# Patient Record
Sex: Male | Born: 1970 | Race: Black or African American | Hispanic: No | Smoking: Current every day smoker
Health system: Southern US, Community
[De-identification: ages and names within clinical notes are randomized; demographics above are authoritative.]

## PROBLEM LIST (undated history)

## (undated) DIAGNOSIS — I609 Nontraumatic subarachnoid hemorrhage, unspecified: Secondary | ICD-10-CM

## (undated) DIAGNOSIS — S065XAA Traumatic subdural hemorrhage with loss of consciousness status unknown, initial encounter: Secondary | ICD-10-CM

## (undated) DIAGNOSIS — I10 Essential (primary) hypertension: Secondary | ICD-10-CM

## (undated) DIAGNOSIS — S065X9A Traumatic subdural hemorrhage with loss of consciousness of unspecified duration, initial encounter: Secondary | ICD-10-CM

## (undated) HISTORY — PX: ANKLE SURGERY: SHX546

## (undated) HISTORY — PX: HERNIA REPAIR: SHX51

---

## 2003-06-23 ENCOUNTER — Emergency Department (HOSPITAL_COMMUNITY): Admission: EM | Admit: 2003-06-23 | Discharge: 2003-06-23 | Payer: Self-pay | Admitting: Emergency Medicine

## 2006-01-23 ENCOUNTER — Emergency Department (HOSPITAL_COMMUNITY): Admission: EM | Admit: 2006-01-23 | Discharge: 2006-01-23 | Payer: Self-pay | Admitting: Emergency Medicine

## 2006-05-04 ENCOUNTER — Emergency Department (HOSPITAL_COMMUNITY): Admission: EM | Admit: 2006-05-04 | Discharge: 2006-05-04 | Payer: Self-pay | Admitting: Emergency Medicine

## 2006-05-07 ENCOUNTER — Emergency Department (HOSPITAL_COMMUNITY): Admission: EM | Admit: 2006-05-07 | Discharge: 2006-05-07 | Payer: Self-pay | Admitting: Emergency Medicine

## 2007-04-28 ENCOUNTER — Emergency Department (HOSPITAL_COMMUNITY): Admission: EM | Admit: 2007-04-28 | Discharge: 2007-04-28 | Payer: Self-pay | Admitting: Emergency Medicine

## 2007-07-13 ENCOUNTER — Emergency Department (HOSPITAL_COMMUNITY): Admission: EM | Admit: 2007-07-13 | Discharge: 2007-07-13 | Payer: Self-pay | Admitting: Emergency Medicine

## 2007-11-15 ENCOUNTER — Emergency Department (HOSPITAL_COMMUNITY): Admission: EM | Admit: 2007-11-15 | Discharge: 2007-11-15 | Payer: Self-pay | Admitting: Family Medicine

## 2007-12-02 ENCOUNTER — Emergency Department (HOSPITAL_COMMUNITY): Admission: EM | Admit: 2007-12-02 | Discharge: 2007-12-03 | Payer: Self-pay | Admitting: Emergency Medicine

## 2007-12-12 ENCOUNTER — Emergency Department (HOSPITAL_COMMUNITY): Admission: EM | Admit: 2007-12-12 | Discharge: 2007-12-12 | Payer: Self-pay | Admitting: Emergency Medicine

## 2008-01-26 ENCOUNTER — Emergency Department (HOSPITAL_COMMUNITY): Admission: EM | Admit: 2008-01-26 | Discharge: 2008-01-26 | Payer: Self-pay | Admitting: Emergency Medicine

## 2009-05-04 ENCOUNTER — Emergency Department (HOSPITAL_COMMUNITY): Admission: EM | Admit: 2009-05-04 | Discharge: 2009-05-04 | Payer: Self-pay | Admitting: Emergency Medicine

## 2009-08-08 ENCOUNTER — Emergency Department (HOSPITAL_COMMUNITY): Admission: EM | Admit: 2009-08-08 | Discharge: 2009-08-08 | Payer: Self-pay | Admitting: Emergency Medicine

## 2009-08-12 ENCOUNTER — Emergency Department (HOSPITAL_COMMUNITY): Admission: EM | Admit: 2009-08-12 | Discharge: 2009-08-12 | Payer: Self-pay | Admitting: Emergency Medicine

## 2009-08-16 ENCOUNTER — Ambulatory Visit (HOSPITAL_COMMUNITY): Admission: RE | Admit: 2009-08-16 | Discharge: 2009-08-16 | Payer: Self-pay | Admitting: Orthopedic Surgery

## 2010-03-06 ENCOUNTER — Emergency Department (HOSPITAL_COMMUNITY): Admission: EM | Admit: 2010-03-06 | Discharge: 2010-03-06 | Payer: Self-pay | Admitting: Emergency Medicine

## 2010-04-15 ENCOUNTER — Emergency Department (HOSPITAL_COMMUNITY): Admission: EM | Admit: 2010-04-15 | Discharge: 2010-04-15 | Payer: Self-pay | Admitting: Family Medicine

## 2010-10-29 LAB — URINALYSIS, ROUTINE W REFLEX MICROSCOPIC
Bilirubin Urine: NEGATIVE
Leukocytes, UA: NEGATIVE
Nitrite: NEGATIVE
Protein, ur: NEGATIVE mg/dL
Urobilinogen, UA: 0.2 mg/dL (ref 0.0–1.0)

## 2010-10-29 LAB — BASIC METABOLIC PANEL
BUN: 9 mg/dL (ref 6–23)
Calcium: 9.1 mg/dL (ref 8.4–10.5)
GFR calc Af Amer: >60 mL/min (ref >60)
Potassium: 3.9 mEq/L (ref 3.5–5.1)
Sodium: 139 mEq/L (ref 135–145)

## 2010-10-29 LAB — DIFFERENTIAL
Eosinophils Absolute: 0.2 10*3/uL (ref 0.0–0.7)
Eosinophils Relative: 2 % (ref 0–5)
Lymphocytes Relative: 17 % (ref 12–46)
Monocytes Relative: 9 % (ref 3–12)

## 2010-10-29 LAB — CBC
HCT: 40.2 % (ref 39.0–52.0)
MCHC: 34.8 g/dL (ref 30.0–36.0)
Platelets: 253 10*3/uL (ref 150–400)
RBC: 4.07 MIL/uL — ABNORMAL LOW (ref 4.22–5.81)
RDW: 12.9 % (ref 11.5–15.5)
WBC: 8.7 10*3/uL (ref 4.0–10.5)

## 2010-10-29 LAB — URINE MICROSCOPIC-ADD ON

## 2010-10-29 LAB — PROTIME-INR: INR: 1.22 (ref 0.00–1.49)

## 2011-05-02 ENCOUNTER — Emergency Department (HOSPITAL_COMMUNITY)
Admission: EM | Admit: 2011-05-02 | Discharge: 2011-05-02 | Disposition: A | Discharge status: Home / Self Care | Payer: Self-pay | Attending: Emergency Medicine | Admitting: Emergency Medicine

## 2011-05-02 DIAGNOSIS — W57XXXA Bitten or stung by nonvenomous insect and other nonvenomous arthropods, initial encounter: Secondary | ICD-10-CM | POA: Insufficient documentation

## 2011-05-02 DIAGNOSIS — T148 Other injury of unspecified body region: Secondary | ICD-10-CM | POA: Insufficient documentation

## 2011-05-02 DIAGNOSIS — L989 Disorder of the skin and subcutaneous tissue, unspecified: Secondary | ICD-10-CM | POA: Insufficient documentation

## 2011-05-08 LAB — URINE CULTURE: Colony Count: 85000

## 2011-05-08 LAB — URINALYSIS, ROUTINE W REFLEX MICROSCOPIC
Bilirubin Urine: NEGATIVE
Glucose, UA: NEGATIVE
Specific Gravity, Urine: 1.023
Urobilinogen, UA: 0.2
pH: 6

## 2011-05-08 LAB — URINE MICROSCOPIC-ADD ON

## 2011-07-08 ENCOUNTER — Emergency Department (HOSPITAL_COMMUNITY)
Admission: EM | Admit: 2011-07-08 | Discharge: 2011-07-09 | Disposition: A | Discharge status: Home / Self Care | Payer: Self-pay | Attending: Emergency Medicine | Admitting: Emergency Medicine

## 2011-07-08 ENCOUNTER — Encounter: Payer: Self-pay | Admitting: Emergency Medicine

## 2011-07-08 DIAGNOSIS — S0180XA Unspecified open wound of other part of head, initial encounter: Secondary | ICD-10-CM | POA: Insufficient documentation

## 2011-07-08 DIAGNOSIS — IMO0002 Reserved for concepts with insufficient information to code with codable children: Secondary | ICD-10-CM
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
## 2011-07-09 MED ORDER — IBUPROFEN 800 MG PO TABS
800.0000 mg | ORAL_TABLET | Freq: Once | ORAL | Status: AC
  Administered 2011-07-09: 800 mg via ORAL
  Filled 2011-07-09: qty 1
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
## 2011-07-09 NOTE — ED Provider Notes (Signed)
Medical screening examination/treatment/procedure(s) were performed by non-physician practitioner and as supervising physician I was immediately available for consultation/collaboration.    Nelia Shi, MD 07/09/11 1320

## 2011-07-09 NOTE — ED Notes (Signed)
Suture cart placed in room.  

## 2012-04-08 ENCOUNTER — Emergency Department (HOSPITAL_COMMUNITY)
Admission: EM | Admit: 2012-04-08 | Discharge: 2012-04-09 | Disposition: A | Discharge status: Home / Self Care | Payer: Self-pay | Attending: Emergency Medicine | Admitting: Emergency Medicine

## 2012-04-08 ENCOUNTER — Encounter (HOSPITAL_COMMUNITY): Payer: Self-pay | Admitting: *Deleted

## 2012-04-08 DIAGNOSIS — M542 Cervicalgia: Secondary | ICD-10-CM | POA: Insufficient documentation

## 2012-04-08 DIAGNOSIS — F17200 Nicotine dependence, unspecified, uncomplicated: Secondary | ICD-10-CM | POA: Insufficient documentation

## 2012-04-08 DIAGNOSIS — M25519 Pain in unspecified shoulder: Secondary | ICD-10-CM | POA: Insufficient documentation

## 2012-04-08 NOTE — ED Notes (Addendum)
Pt reports a "deep burning pain" to "everywhere on his neck" - pt states the pain started approx 3 weeks ago, worse in the last week. Pain radiates to rt shoulder and ribs. Pt admits to injuring rt shoulder x9yrs ago while lifting weights. Pt denies any known fever. Pt states he has been taking ibuprofen at home w/o relief.

## 2012-04-09 ENCOUNTER — Emergency Department (HOSPITAL_COMMUNITY): Payer: Self-pay

## 2012-04-09 MED ORDER — NAPROXEN 500 MG PO TABS: 500.0000 mg | ORAL_TABLET | Freq: Two times a day (BID) | ORAL | Status: DC

## 2012-04-09 MED ORDER — CYCLOBENZAPRINE HCL 10 MG PO TABS: 10.0000 mg | ORAL_TABLET | Freq: Three times a day (TID) | ORAL | Status: AC | PRN

## 2012-04-09 MED ORDER — HYDROCODONE-ACETAMINOPHEN 5-325 MG PO TABS
1.0000 | ORAL_TABLET | Freq: Once | ORAL | Status: AC
  Administered 2012-04-09: 1 via ORAL
  Filled 2012-04-09: qty 1
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
## 2012-04-09 NOTE — ED Notes (Signed)
Rx given x2 Pt ambulating independently w/ steady gait on d/c in no acute distress, A&Ox4. D/c instructions reviewed w/ pt - pt denies any further questions or concerns at present.   

## 2012-04-09 NOTE — ED Notes (Signed)
Patient transported to X-ray 

## 2012-04-09 NOTE — ED Notes (Signed)
Pt w/ "deep neck burning" that radiates to rt shoulder and ribs - pt states pain has been occurring approx 3 weeks, progressively worse in the last week. Pt denies any known mechanism of injury, admits to injury rt shoulder 10 yrs ago while lifting weights.

## 2012-04-12 NOTE — ED Provider Notes (Signed)
Medical screening examination/treatment/procedure(s) were performed by non-physician practitioner and as supervising physician I was immediately available for consultation/collaboration.  Aaira Oestreicher R. Kishan Wachsmuth, MD 04/12/12 0656 

## 2012-11-14 ENCOUNTER — Encounter (HOSPITAL_COMMUNITY): Payer: Self-pay | Admitting: Emergency Medicine

## 2012-11-14 ENCOUNTER — Emergency Department (HOSPITAL_COMMUNITY)
Admission: EM | Admit: 2012-11-14 | Discharge: 2012-11-14 | Disposition: A | Discharge status: Home / Self Care | Payer: Medicaid Other | Attending: Emergency Medicine | Admitting: Emergency Medicine

## 2012-11-14 ENCOUNTER — Emergency Department (HOSPITAL_COMMUNITY): Payer: Medicaid Other

## 2012-11-14 DIAGNOSIS — F17200 Nicotine dependence, unspecified, uncomplicated: Secondary | ICD-10-CM | POA: Insufficient documentation

## 2012-11-14 DIAGNOSIS — Y939 Activity, unspecified: Secondary | ICD-10-CM | POA: Insufficient documentation

## 2012-11-14 DIAGNOSIS — S6990XA Unspecified injury of unspecified wrist, hand and finger(s), initial encounter: Secondary | ICD-10-CM | POA: Insufficient documentation

## 2012-11-14 DIAGNOSIS — W2209XA Striking against other stationary object, initial encounter: Secondary | ICD-10-CM | POA: Insufficient documentation

## 2012-11-14 DIAGNOSIS — S6991XA Unspecified injury of right wrist, hand and finger(s), initial encounter: Secondary | ICD-10-CM

## 2012-11-14 DIAGNOSIS — Y929 Unspecified place or not applicable: Secondary | ICD-10-CM | POA: Insufficient documentation

## 2012-11-14 MED ORDER — HYDROCODONE-ACETAMINOPHEN 5-325 MG PO TABS
2.0000 | ORAL_TABLET | Freq: Once | ORAL | Status: AC
  Administered 2012-11-14: 2 via ORAL
  Filled 2012-11-14: qty 2

## 2012-11-14 NOTE — Progress Notes (Signed)
Orthopedic Tech Progress Note Patient Details:  Derek Austin July 01, 1971 [id number] Ulna gutter splint applied to Right UE. Patient tolerated application well. Arm sling applied. Ortho Devices Type of Ortho Device: Arm sling;Ulna gutter splint Ortho Device/Splint Location: Right Ortho Device/Splint Interventions: Application   Asia R Thompson 11/14/2012, 1:18 PM

## 2012-11-14 NOTE — ED Notes (Signed)
Paged ortho 

## 2012-11-14 NOTE — ED Notes (Signed)
Pt c/o right hand pain with swelling after punching wall yesterday

## 2012-11-14 NOTE — ED Notes (Signed)
Pt reports hitting a wall yesterday with rt hand.  Rt hand is obviously swollen no obvious deformity.   Pt has normal sensation and movement although increased pain with movement.  Pt has taken no meds and has not applied ice to injury.  Pt alert oriented X4

## 2012-11-14 NOTE — ED Provider Notes (Signed)
History     CSN: [id number]  Arrival date & time 11/14/12  1040   First MD Initiated Contact with Patient 11/14/12 1115      Chief Complaint  Patient presents with  . Hand Pain    (Consider location/radiation/quality/duration/timing/severity/associated sxs/prior treatment) HPI Comments: 42 year old male presents emergency department complaining of right hand pain and swelling after punching a wall yesterday. Patient states he was angry and punched a wall twice. Overnight his hand has become severely painful rated 10 out of 10 and is completely swollen. Also has some superficial abrasions to his knuckles. He tried applying ice without any relief. Denies numbness or tingling in his hand.  Patient is a 42 y.o. male presenting with hand pain. The history is provided by the patient.  Hand Pain Associated symptoms include arthralgias (right hand pain) and joint swelling. Pertinent negatives include no numbness.    History reviewed. No pertinent past medical history.  Past Surgical History  Procedure Laterality Date  . Ankle surgery    . Hernia repair      History reviewed. No pertinent family history.  History  Substance Use Topics  . Smoking status: Current Every Day Smoker -- 1.00 packs/day  . Smokeless tobacco: Not on file  . Alcohol Use: Yes     Comment: 6 pack of beer per day      Review of Systems  Musculoskeletal: Positive for joint swelling and arthralgias (right hand pain).  Skin: Positive for wound.  Neurological: Negative for numbness.  All other systems reviewed and are negative.    Allergies  Review of patient's allergies indicates no known allergies.  Home Medications  No current outpatient prescriptions on file.  BP 151/89  Pulse 78  Temp(Src) 97.8 F (36.6 C)  SpO2 98%  Physical Exam  Nursing note and vitals reviewed. Constitutional: He is oriented to person, place, and time. He appears well-developed and well-nourished. No distress.  HENT:   Head: Normocephalic and atraumatic.  Eyes: Conjunctivae and EOM are normal.  Neck: Normal range of motion. Neck supple.  Cardiovascular: Normal rate, regular rhythm and normal heart sounds.   Pulmonary/Chest: Effort normal and breath sounds normal.  Musculoskeletal:  Generalized tenderness to palpation overall metacarpal bones, more prominent on the fourth and fifth. Marked edema noted throughout entire hand. Capillary refill less than 3 seconds. Decreased range of motion limited by pain.  Neurological: He is alert and oriented to person, place, and time. No sensory deficit.  Skin: Skin is warm and dry.  Superficial abrasions noted over the fourth and fifth MCP joint.  Psychiatric: He has a normal mood and affect. His behavior is normal.    ED Course  Procedures (including critical care time)  Labs Reviewed - No data to display Dg Hand Complete Right  11/14/2012  *RADIOLOGY REPORT*  Clinical Data: Pain post injury 1 day  ago  RIGHT HAND - COMPLETE 3+ VIEW  Comparison: 11/15/2007  Findings: Three views of the right hand submitted.  There is dorsal soft tissue swelling. Again noted old fracture of the right fourth metacarpal.  IMPRESSION: No acute fracture or subluxation.  Dorsal soft tissue swelling metacarpal region.  Old fracture of the right fourth metacarpal.   Original Report Authenticated By: Natasha Mead, M.D.      1. Hand injury, right, initial encounter       MDM  42 year old male with right hand injury after punching a wall. X-ray without any acute fracture, however hand is markedly swollen and he does have  old fracture on x-ray. Patient is aware of this fracture. I will apply splint the patient and have him followup with orthopedics. Advised him to keep his arm elevated. Return precautions discussed. Patient states understanding of plan and is agreeable.        Trevor Mace, PA-C 11/14/12 1222

## 2012-11-21 NOTE — ED Provider Notes (Signed)
Medical screening examination/treatment/procedure(s) were performed by non-physician practitioner and as supervising physician I was immediately available for consultation/collaboration.  Claudie Rathbone, MD 11/21/12 1838 

## 2013-01-18 DIAGNOSIS — W1809XA Striking against other object with subsequent fall, initial encounter: Secondary | ICD-10-CM | POA: Diagnosis present

## 2013-01-18 DIAGNOSIS — F1210 Cannabis abuse, uncomplicated: Secondary | ICD-10-CM | POA: Diagnosis present

## 2013-01-18 DIAGNOSIS — S0180XA Unspecified open wound of other part of head, initial encounter: Secondary | ICD-10-CM | POA: Diagnosis present

## 2013-01-18 DIAGNOSIS — S065X0A Traumatic subdural hemorrhage without loss of consciousness, initial encounter: Principal | ICD-10-CM | POA: Diagnosis present

## 2013-01-18 DIAGNOSIS — F17200 Nicotine dependence, unspecified, uncomplicated: Secondary | ICD-10-CM | POA: Diagnosis present

## 2013-01-19 ENCOUNTER — Inpatient Hospital Stay (HOSPITAL_COMMUNITY)
Admission: EM | Admit: 2013-01-19 | Discharge: 2013-01-22 | DRG: 087 | Disposition: A | Discharge status: Home / Self Care | Payer: Medicaid Other | Attending: Neurosurgery | Admitting: Neurosurgery

## 2013-01-19 ENCOUNTER — Encounter (HOSPITAL_COMMUNITY): Payer: Self-pay | Admitting: Emergency Medicine

## 2013-01-19 ENCOUNTER — Emergency Department (HOSPITAL_COMMUNITY): Payer: Medicaid Other

## 2013-01-19 DIAGNOSIS — I609 Nontraumatic subarachnoid hemorrhage, unspecified: Secondary | ICD-10-CM

## 2013-01-19 DIAGNOSIS — S065X9A Traumatic subdural hemorrhage with loss of consciousness of unspecified duration, initial encounter: Secondary | ICD-10-CM

## 2013-01-19 MED ORDER — SODIUM CHLORIDE 0.9 % NICU IV INFUSION SIMPLE
INJECTION | INTRAVENOUS | Status: DC
  Administered 2013-01-20: 07:00:00 via INTRAVENOUS
  Filled 2013-01-19 (×15): qty 500

## 2013-01-19 MED ORDER — MORPHINE SULFATE 2 MG/ML IJ SOLN
2.0000 mg | INTRAMUSCULAR | Status: DC | PRN
  Administered 2013-01-19 – 2013-01-22 (×13): 2 mg via INTRAVENOUS
  Filled 2013-01-19 (×13): qty 1

## 2013-01-19 MED ORDER — MORPHINE SULFATE 2 MG/ML IJ SOLN
2.0000 mg | Freq: Once | INTRAMUSCULAR | Status: AC
  Administered 2013-01-19: 2 mg via INTRAVENOUS
  Filled 2013-01-19: qty 1

## 2013-01-19 MED ORDER — ONDANSETRON HCL 4 MG/2ML IJ SOLN: 4.0000 mg | Freq: Four times a day (QID) | INTRAMUSCULAR | Status: DC | PRN
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
## 2013-01-19 NOTE — ED Provider Notes (Signed)
Medical screening examination/treatment/procedure(s) were conducted as a shared visit with non-physician practitioner(s) and myself.  I personally evaluated the patient during the encounter  BP 172/130  Pulse 60  Temp(Src) 97.9 F (36.6 C) (Oral)  Resp 16  SpO2 99%  General Appearance:    Alert, cooperative, no distress, appears stated age  Head:    Normocephalic forehead laceration and lip lac  Eyes:    PERRL, conjunctiva/corneas clear, EOM's intact, fundi    benign, both eyes       Ears:    Normal TM's and external ear canals, both ears no hemotympanum  Nose:   Nares normal, septum midline, mucosa normal, no drainage   or sinus tenderness  Throat:  mucosa, and tongue normal; teeth and gums normal  Neck:   Supple, symmetrical, trachea midline, no adenopathy;       thyroid:  No enlargement/tenderness/nodules; no carotid   bruit or JVD  Back:     Symmetric, no curvature, ROM normal, no CVA tenderness  Lungs:     Clear to auscultation bilaterally, respirations unlabored  Chest wall:    No tenderness or deformity  Heart:    Regular rate and rhythm, S1 and S2 normal, no murmur, rub   or gallop  Abdomen:     Soft, non-tender, bowel sounds active all four quadrants,    no masses, no organomegaly     :    Extremities:   Extremities normal, atraumatic, no cyanosis or edema  Pulses:   2+ and symmetric all extremities  Skin:   Skin color, texture, turgor normal, no rashes or lesions  Lymph nodes: deferred  Neurologic:   Normal strength, sensation and reflexes      throughout  Plan admit to neurosurgery  Cherrise Occhipinti K Nichlos Kunzler-Rasch, MD 01/19/13 [phone]
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
## 2013-01-19 NOTE — Progress Notes (Signed)
Patient ID: Derek Austin, male   DOB: [birth year], 42 y.o.   MRN: [id number] NEURO STABLE, C/O HEADACHE. CT HEAD IN AM
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
## 2013-01-20 ENCOUNTER — Observation Stay (HOSPITAL_COMMUNITY): Payer: Medicaid Other

## 2013-01-20 MED ORDER — WHITE PETROLATUM GEL
Status: AC
  Administered 2013-01-20
  Filled 2013-01-20: qty 5

## 2013-01-20 NOTE — Progress Notes (Signed)
Patient ID: Derek Austin, male   DOB: 04-Oct-1970, 42 y.o.   MRN: [id number] Doing well. No headache,eating ,ambulating. To get a ct head today and decide about discharge

## 2013-01-20 NOTE — Progress Notes (Signed)
Patient ID: Derek Austin, male   DOB: July 02, 1971, 42 y.o.   MRN: [id number] Ct head seen showing increase of sdh right temporal. Plan to keep him in the hospital and repeat ct in 48 to 72 hours

## 2013-01-21 NOTE — Progress Notes (Signed)
Patient ID: Derek Austin, male   DOB: June 07, 1971, 42 y.o.   MRN: [id number] C/o of some headache, no weakness, eating with no n/v ct head in am

## 2013-01-22 ENCOUNTER — Encounter (HOSPITAL_COMMUNITY): Payer: Self-pay

## 2013-01-22 ENCOUNTER — Inpatient Hospital Stay (HOSPITAL_COMMUNITY): Payer: Medicaid Other

## 2013-01-22 MED ORDER — OXYCODONE-ACETAMINOPHEN 5-325 MG PO TABS
1.0000 | ORAL_TABLET | ORAL | Status: DC | PRN
  Administered 2013-01-22: 2 via ORAL
  Filled 2013-01-22: qty 2

## 2013-01-22 NOTE — Discharge Summary (Signed)
Physician Discharge Summary  Patient ID: Waverly Chavarria MRN: [id number] DOB/AGE: 1970-12-22 42 y.o.  Admit date: 01/19/2013 Discharge date: 01/22/2013  Admission Diagnoses:chi. sdh  Discharge Diagnoses: same Active Problems:   * No active hospital problems. *   Discharged Condition: neuro normal  Hospital Course: observation  Consults:none  Significant Diagnostic Studies: ct head  Treatments: observation Discharge Exam: Blood pressure 132/83, pulse 50, temperature 97.5 F (36.4 C), temperature source Oral, resp. rate 18, height 5\' 9"  (1.753 m), weight 65.5 kg (144 lb 6.4 oz), SpO2 100.00%. Neuro normal, some swelling of face  Disposition: home, to stay away from alcohol. To see me in 3 weeks     Medication List     As of 01/22/2013  5:20 PM    Notice      You have not been prescribed any medications.          Signed: Karn Cassis 01/22/2013, 5:20 PM

## 2013-01-23 NOTE — Care Management Note (Signed)
    Page 1 of 1   01/23/2013     8:04:22 AM   CARE MANAGEMENT NOTE 01/23/2013  Patient:  Derek Austin, Derek Austin   Account Number:  [id number]  Date Initiated:  01/19/2013  Documentation initiated by:  Jacquelynn Cree  Subjective/Objective Assessment:   admitted with [facility]     Action/Plan:   Anticipated DC Date:  01/20/2013   Anticipated DC Plan:  HOME/SELF CARE      DC Planning Services  CM consult      Choice offered to / List presented to:             Status of service:  Completed, signed off Medicare Important Message given?   (If response is "NO", the following Medicare IM given date fields will be blank) Date Medicare IM given:   Date Additional Medicare IM given:    Discharge Disposition:  HOME/SELF CARE  Per UR Regulation:  Reviewed for med. necessity/level of care/duration of stay  If discussed at Long Length of Stay Meetings, dates discussed:    Comments:

## 2013-01-28 ENCOUNTER — Encounter (HOSPITAL_COMMUNITY): Payer: Self-pay | Admitting: Family Medicine

## 2013-01-28 ENCOUNTER — Emergency Department (HOSPITAL_COMMUNITY): Payer: Medicaid Other

## 2013-01-28 ENCOUNTER — Emergency Department (HOSPITAL_COMMUNITY)
Admission: EM | Admit: 2013-01-28 | Discharge: 2013-01-28 | Disposition: A | Discharge status: Home / Self Care | Payer: Medicaid Other | Attending: Emergency Medicine | Admitting: Emergency Medicine

## 2013-01-28 DIAGNOSIS — W010XXA Fall on same level from slipping, tripping and stumbling without subsequent striking against object, initial encounter: Secondary | ICD-10-CM | POA: Insufficient documentation

## 2013-01-28 DIAGNOSIS — S5000XA Contusion of unspecified elbow, initial encounter: Secondary | ICD-10-CM | POA: Insufficient documentation

## 2013-01-28 DIAGNOSIS — Y929 Unspecified place or not applicable: Secondary | ICD-10-CM | POA: Insufficient documentation

## 2013-01-28 DIAGNOSIS — Z87820 Personal history of traumatic brain injury: Secondary | ICD-10-CM | POA: Insufficient documentation

## 2013-01-28 DIAGNOSIS — S5001XA Contusion of right elbow, initial encounter: Secondary | ICD-10-CM

## 2013-01-28 DIAGNOSIS — Y9383 Activity, rough housing and horseplay: Secondary | ICD-10-CM | POA: Insufficient documentation

## 2013-01-28 DIAGNOSIS — F0781 Postconcussional syndrome: Secondary | ICD-10-CM | POA: Insufficient documentation

## 2013-01-28 HISTORY — DX: Traumatic subdural hemorrhage with loss of consciousness status unknown, initial encounter: S06.5XAA

## 2013-01-28 HISTORY — DX: Nontraumatic subarachnoid hemorrhage, unspecified: I60.9

## 2013-01-28 HISTORY — DX: Traumatic subdural hemorrhage with loss of consciousness of unspecified duration, initial encounter: S06.5X9A

## 2013-01-28 MED ORDER — OXYCODONE-ACETAMINOPHEN 5-325 MG PO TABS
1.0000 | ORAL_TABLET | Freq: Once | ORAL | Status: AC
  Administered 2013-01-28: 1 via ORAL
  Filled 2013-01-28: qty 1
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
## 2013-01-28 NOTE — ED Notes (Addendum)
EDPA and Charge RN made aware, pt's pupils slightly unequal.

## 2013-01-28 NOTE — ED Notes (Signed)
Per pt he was playing with his niece and fell on elbow. Pt has significant swelling to elbow.

## 2013-01-28 NOTE — ED Notes (Signed)
Pt reports was recently discharged from [facility] for "head bleed". States has been having headaches increasing in intensity since discharge. Pt reports today while playing with his niece, he became very lightheaded and fell and hit elbow on concrete. PT denies LOC.

## 2013-08-13 ENCOUNTER — Emergency Department (HOSPITAL_COMMUNITY): Payer: Medicaid Other

## 2013-08-13 ENCOUNTER — Emergency Department (HOSPITAL_COMMUNITY)
Admission: EM | Admit: 2013-08-13 | Discharge: 2013-08-13 | Disposition: A | Discharge status: Home / Self Care | Payer: Medicaid Other | Attending: Emergency Medicine | Admitting: Emergency Medicine

## 2013-08-13 DIAGNOSIS — S99922A Unspecified injury of left foot, initial encounter: Secondary | ICD-10-CM

## 2013-08-13 DIAGNOSIS — X58XXXA Exposure to other specified factors, initial encounter: Secondary | ICD-10-CM | POA: Insufficient documentation

## 2013-08-13 DIAGNOSIS — S99929A Unspecified injury of unspecified foot, initial encounter: Principal | ICD-10-CM

## 2013-08-13 DIAGNOSIS — Y9389 Activity, other specified: Secondary | ICD-10-CM | POA: Insufficient documentation

## 2013-08-13 DIAGNOSIS — S8990XA Unspecified injury of unspecified lower leg, initial encounter: Secondary | ICD-10-CM | POA: Insufficient documentation

## 2013-08-13 DIAGNOSIS — F17200 Nicotine dependence, unspecified, uncomplicated: Secondary | ICD-10-CM | POA: Insufficient documentation

## 2013-08-13 DIAGNOSIS — S99919A Unspecified injury of unspecified ankle, initial encounter: Principal | ICD-10-CM

## 2013-08-13 DIAGNOSIS — Y929 Unspecified place or not applicable: Secondary | ICD-10-CM | POA: Insufficient documentation

## 2013-08-13 MED ORDER — HYDROCODONE-ACETAMINOPHEN 5-325 MG PO TABS: 1.0000 | ORAL_TABLET | ORAL | Status: DC | PRN

## 2013-08-13 NOTE — ED Notes (Signed)
PA at bedside.
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
## 2013-08-13 NOTE — Discharge Instructions (Signed)
Take the prescribed medication as directed.  Do not drive while taking this medications. Follow-up with orthopedics in 1 week if no improvement of symptoms. Return to the ED for new or worsening symptoms.

## 2013-08-13 NOTE — ED Provider Notes (Signed)
Medical screening examination/treatment/procedure(s) were performed by non-physician practitioner and as supervising physician I was immediately available for consultation/collaboration.  EKG Interpretation   None         Yajahira Tison H Duy Lemming, MD 08/13/13 2357 
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
## 2014-05-21 ENCOUNTER — Emergency Department (HOSPITAL_COMMUNITY)
Admission: EM | Admit: 2014-05-21 | Discharge: 2014-05-21 | Disposition: A | Discharge status: Home / Self Care | Payer: Medicaid Other | Attending: Emergency Medicine | Admitting: Emergency Medicine

## 2014-05-21 ENCOUNTER — Encounter (HOSPITAL_COMMUNITY): Payer: Self-pay | Admitting: Emergency Medicine

## 2014-05-21 DIAGNOSIS — I10 Essential (primary) hypertension: Secondary | ICD-10-CM | POA: Insufficient documentation

## 2014-05-21 DIAGNOSIS — J069 Acute upper respiratory infection, unspecified: Secondary | ICD-10-CM | POA: Insufficient documentation

## 2014-05-21 DIAGNOSIS — H6121 Impacted cerumen, right ear: Secondary | ICD-10-CM | POA: Insufficient documentation

## 2014-05-21 DIAGNOSIS — R51 Headache: Secondary | ICD-10-CM | POA: Insufficient documentation

## 2014-05-21 DIAGNOSIS — Z720 Tobacco use: Secondary | ICD-10-CM | POA: Insufficient documentation

## 2014-05-21 HISTORY — DX: Essential (primary) hypertension: I10

## 2014-05-21 MED ORDER — CARBAMIDE PEROXIDE 6.5 % OT SOLN: 5.0000 [drp] | Freq: Two times a day (BID) | OTIC | Status: AC | PRN

## 2014-05-21 MED ORDER — SALINE SPRAY 0.65 % NA SOLN: 1.0000 | NASAL | Status: AC | PRN

## 2014-05-21 MED ORDER — IBUPROFEN 800 MG PO TABS: 800.0000 mg | ORAL_TABLET | Freq: Three times a day (TID) | ORAL | Status: DC

## 2014-05-21 MED ORDER — DOCUSATE SODIUM 50 MG/5ML PO LIQD
10.0000 mg | Freq: Once | ORAL | Status: AC
  Administered 2014-05-21: 10 mg via OTIC
  Filled 2014-05-21: qty 10

## 2014-05-21 MED ORDER — PROCHLORPERAZINE MALEATE 10 MG PO TABS
10.0000 mg | ORAL_TABLET | Freq: Once | ORAL | Status: AC
Start: 2014-05-21 — End: 2014-05-21
  Administered 2014-05-21: 10 mg via ORAL
  Filled 2014-05-21: qty 1

## 2014-05-21 MED ORDER — METOCLOPRAMIDE HCL 10 MG PO TABS
10.0000 mg | ORAL_TABLET | Freq: Once | ORAL | Status: AC
  Administered 2014-05-21: 10 mg via ORAL
  Filled 2014-05-21 (×2): qty 1

## 2014-05-21 NOTE — ED Provider Notes (Signed)
CSN: [id number]     Arrival date & time 05/21/14  1920 History  This chart was scribed for Derek DeisJen Hendricks Schwandt, PA-C, working with No att. providers found by Leona CarryG. Clay Sherrill, ED Scribe. The patient was seen in TR05C/TR05C. The patient's care was started at 7:53 PM.    Chief Complaint  Patient presents with  . Otalgia    HPI HPI Comments: Derek Austin is a 43 y.o. male who presents to the Emergency Department complaining of throbbing right ear pain beginning four days ago. Patient reports associated headaches, rhinorrhea, sore throat, and cough. No alleviating or aggravating factors. No medications taken prior to arrival. 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 He denies any recent sick exposure at home. No recent falls or traumas.  Patient does not have a PCP.   Past Medical History  Diagnosis Date  . Subdural hematoma   . Subarachnoid bleed   . Hypertension    Past Surgical History  Procedure Laterality Date  . Ankle surgery    . Hernia repair     No family history on file. History  Substance Use Topics  . Smoking status: Current Every Day Smoker -- 1.00 packs/day  . Smokeless tobacco: Not on file  . Alcohol Use: Yes     Comment: 6 pack of beer per day    Review of Systems  HENT: Positive for ear pain, postnasal drip, rhinorrhea and sore throat.   Respiratory: Positive for cough.   Neurological: Positive for headaches.  All other systems reviewed and are negative.     Allergies  Review of patient's allergies indicates no known allergies.  Home Medications   Prior to Admission medications   Medication Sig Start Date End Date Taking? Authorizing Provider  carbamide peroxide (DEBROX) 6.5 % otic solution Place 5 drops into the right ear 2 (two) times daily as needed. 05/21/14   Angelene Rome L Quinzell Malcomb, PA-C  ibuprofen (ADVIL,MOTRIN) 800 MG tablet Take 1 tablet (800 mg total) by mouth 3 (three) times daily. 05/21/14    Shiryl Ruddy L Nasia Cannan, PA-C  sodium chloride (OCEAN) 0.65 % SOLN nasal spray Place 1 spray into both nostrils as needed for congestion. 05/21/14   Quatisha Zylka L Lakaisha Danish, PA-C   BP 140/98  Pulse 98  Temp(Src) 98.5 F (36.9 C) (Oral)  Resp 14  Ht 5\' 9"  (1.753 m)  Wt 146 lb (66.225 kg)  BMI 21.55 kg/m2  SpO2 99% Physical Exam  Nursing note and vitals reviewed. Constitutional: He is oriented to person, place, and time. He appears well-developed and well-nourished. No distress.  HENT:  Head: Normocephalic and atraumatic.  Right Ear: Hearing, external ear and ear canal normal.  Left Ear: Hearing, tympanic membrane, external ear and ear canal normal.  Nose: Rhinorrhea present.  Mouth/Throat: Oropharynx is clear and moist. No oropharyngeal exudate.  Nasal congestion Right cerumen impaction.   Eyes: Conjunctivae and EOM are normal. Pupils are equal, round, and reactive to light.  Neck: Normal range of motion. Neck supple.  Cardiovascular: Normal rate, regular rhythm, normal heart sounds and intact distal pulses.   Pulmonary/Chest: Effort normal and breath sounds normal.  Abdominal: Soft.  Musculoskeletal: Normal range of motion.  Lymphadenopathy:    He has no cervical adenopathy.  Neurological: He is alert and oriented to person, place, and time.  Skin: Skin is warm and dry. He is not diaphoretic.  Psychiatric: He has a normal mood and affect.    ED Course  EAR CERUMEN REMOVAL Date/Time: 05/21/2014 10:58 PM Performed by: Jeannetta EllisPIEPENBRINK, Devonna Oboyle L Authorized by: Jeannetta EllisPIEPENBRINK, Baker Kogler L Consent: Verbal consent obtained. Consent given by: patient Patient identity confirmed: verbally with patient Time out: Immediately prior to procedure a "time out" was called to verify the correct patient, procedure, equipment, support staff and site/side marked as required. Local anesthetic: none Location details: right ear Procedure type: curette and irrigation Patient sedated: no Patient  tolerance: Patient tolerated the procedure well with no immediate complications.   (including critical care time) Medications  docusate (COLACE) 50 MG/5ML liquid 10 mg (10 mg Right Ear Given 05/21/14 2058)  metoCLOPramide (REGLAN) tablet 10 mg (10 mg Oral Given 05/21/14 2231)  prochlorperazine (COMPAZINE) tablet 10 mg (10 mg Oral Given 05/21/14 2231)    DIAGNOSTIC STUDIES: Oxygen Saturation is 99% on room air, normal by my interpretation.    COORDINATION OF CARE: 7:56 PM-Discussed treatment plan which includes Colace and removal of excess cerumen with pt at bedside and pt agreed to plan.     Labs Review Labs Reviewed - No data to display  Imaging Review No results found.   EKG Interpretation None       MDM   Final diagnoses:  Cerumen impaction, right  URI (upper respiratory infection)    Filed Vitals:   05/21/14 1927  BP: 140/98  Pulse: 98  Temp: 98.5 F (36.9 C)  Resp: 14   Afebrile, NAD, non-toxic appearing, AAOx4. No neurofocal deficits on examination. Cerumen impaction improved after removal. No TM abnormality. Patients symptoms are consistent with URI, likely viral etiology. Discussed that antibiotics are not indicated for viral infections. Pt will be discharged with symptomatic treatment.  Verbalizes understanding and is agreeable with plan. Pt is hemodynamically stable & in NAD prior to dc. Patient is stable at time of discharge    I personally performed the services described in this documentation, which was scribed in my presence. The recorded information has been reviewed and is accurate.     Jeannetta EllisJennifer L Alisandra Son, PA-C 05/21/14 2330

## 2014-05-21 NOTE — Discharge Instructions (Signed)
Please follow up with your primary care physician in 1-2 days. If you do not have one please call the [facility] number listed above. Please use medications as prescribed. Please read all discharge instructions and return precautions.    Cerumen Impaction A cerumen impaction is when the wax in your ear forms a plug. This plug usually causes reduced hearing. Sometimes it also causes an earache or dizziness. Removing a cerumen impaction can be difficult and painful. The wax sticks to the ear canal. The canal is sensitive and bleeds easily. If you try to remove a heavy wax buildup with a cotton tipped swab, you may push it in further. Irrigation with water, suction, and small ear curettes may be used to clear out the wax. If the impaction is fixed to the skin in the ear canal, ear drops may be needed for a few days to loosen the wax. People who build up a lot of wax frequently can use ear wax removal products available in your local drugstore. SEEK MEDICAL CARE IF:  You develop an earache, increased hearing loss, or marked dizziness. Document Released: 09/06/2004 Document Revised: 10/22/2011 Document Reviewed: 10/27/2009 [facility] Patient Information 2015 [employer], [place]. This information is not intended to replace advice given to you by your health care provider. Make sure you discuss any questions you have with your health care provider. Upper Respiratory Infection, Adult An upper respiratory infection (URI) is also known as the common cold. It is often caused by a type of germ (virus). Colds are easily spread (contagious). You can pass it to others by kissing, coughing, sneezing, or drinking out of the same glass. Usually, you get better in 1 or 2 weeks.  HOME CARE   Only take medicine as told by your doctor.  Use a warm mist humidifier or breathe in steam from a hot shower.  Drink enough water and fluids to keep your pee (urine) clear or pale yellow.  Get plenty of  rest.  Return to work when your temperature is back to normal or as told by your doctor. You may use a face mask and wash your hands to stop your cold from spreading. GET HELP RIGHT AWAY IF:   After the first few days, you feel you are getting worse.  You have questions about your medicine.  You have chills, shortness of breath, or brown or red spit (mucus).  You have yellow or brown snot (nasal discharge) or pain in the face, especially when you bend forward.  You have a fever, puffy (swollen) neck, pain when you swallow, or white spots in the back of your throat.  You have a bad headache, ear pain, sinus pain, or chest pain.  You have a high-pitched whistling sound when you breathe in and out (wheezing).  You have a lasting cough or cough up blood.  You have sore muscles or a stiff neck. MAKE SURE YOU:   Understand these instructions.  Will watch your condition.  Will get help right away if you are not doing well or get worse. Document Released: 01/16/2008 Document Revised: 10/22/2011 Document Reviewed: 11/04/2013 [facility] Patient Information 2015 [employer] [place], [place]. This information is not intended to replace advice given to you by your health care provider. Make sure you discuss any questions you have with your health care provider.

## 2014-05-21 NOTE — ED Notes (Signed)
Pt. reports right ear pain with drainage onset 4 days ago , denies injury / no fever , mild hearing loss.

## 2014-05-22 NOTE — ED Provider Notes (Signed)
Medical screening examination/treatment/procedure(s) were performed by non-physician practitioner and as supervising physician I was immediately available for consultation/collaboration.   EKG Interpretation None       Doug SouSam Dung Prien, MD 05/22/14 [id number]

## 2014-12-06 ENCOUNTER — Emergency Department (INDEPENDENT_AMBULATORY_CARE_PROVIDER_SITE_OTHER)
Admission: EM | Admit: 2014-12-06 | Discharge: 2014-12-06 | Disposition: A | Discharge status: Home / Self Care | Payer: Self-pay | Source: Home / Self Care | Attending: Family Medicine | Admitting: Family Medicine

## 2014-12-06 ENCOUNTER — Encounter (HOSPITAL_COMMUNITY): Payer: Self-pay | Admitting: Emergency Medicine

## 2014-12-06 DIAGNOSIS — S29012A Strain of muscle and tendon of back wall of thorax, initial encounter: Secondary | ICD-10-CM

## 2014-12-06 DIAGNOSIS — S39012A Strain of muscle, fascia and tendon of lower back, initial encounter: Secondary | ICD-10-CM

## 2014-12-06 DIAGNOSIS — S29019A Strain of muscle and tendon of unspecified wall of thorax, initial encounter: Secondary | ICD-10-CM

## 2014-12-06 MED ORDER — KETOROLAC TROMETHAMINE 60 MG/2ML IM SOLN
INTRAMUSCULAR | Status: AC
  Filled 2014-12-06: qty 2

## 2014-12-06 MED ORDER — TRAMADOL HCL 50 MG PO TABS: ORAL_TABLET | ORAL | Status: DC

## 2014-12-06 MED ORDER — CYCLOBENZAPRINE HCL 5 MG PO TABS: 5.0000 mg | ORAL_TABLET | Freq: Three times a day (TID) | ORAL | Status: DC | PRN

## 2014-12-06 MED ORDER — KETOROLAC TROMETHAMINE 60 MG/2ML IM SOLN
60.0000 mg | Freq: Once | INTRAMUSCULAR | Status: AC
  Administered 2014-12-06: 60 mg via INTRAMUSCULAR

## 2014-12-06 MED ORDER — DICLOFENAC POTASSIUM 50 MG PO TABS: 50.0000 mg | ORAL_TABLET | Freq: Three times a day (TID) | ORAL | Status: DC

## 2014-12-06 NOTE — ED Notes (Signed)
C/o back pain for four days States lower back pain radiates to left shoulder and neck States back hurts to walk Patient did get massage

## 2014-12-06 NOTE — Discharge Instructions (Signed)
Back Pain, Adult Low back pain is very common. About 1 in 5 people have back pain.The cause of low back pain is rarely dangerous. The pain often gets better over time.About half of people with a sudden onset of back pain feel better in just 2 weeks. About 8 in 10 people feel better by 6 weeks.  CAUSES Some common causes of back pain include:  Strain of the muscles or ligaments supporting the spine.  Wear and tear (degeneration) of the spinal discs.  Arthritis.  Direct injury to the back. DIAGNOSIS Most of the time, the direct cause of low back pain is not known.However, back pain can be treated effectively even when the exact cause of the pain is unknown.Answering your caregiver's questions about your overall health and symptoms is one of the most accurate ways to make sure the cause of your pain is not dangerous. If your caregiver needs more information, he or she may order lab work or imaging tests (X-rays or MRIs).However, even if imaging tests show changes in your back, this usually does not require surgery. HOME CARE INSTRUCTIONS For many people, back pain returns.Since low back pain is rarely dangerous, it is often a condition that people can learn to [facility] their own.   Remain active. It is stressful on the back to sit or stand in one place. Do not sit, drive, or stand in one place for more than 30 minutes at a time. Take short walks on level surfaces as soon as pain allows.Try to increase the length of time you walk each day.  Do not stay in bed.Resting more than 1 or 2 days can delay your recovery.  Do not avoid exercise or work.Your body is made to move.It is not dangerous to be active, even though your back may hurt.Your back will likely heal faster if you return to being active before your pain is gone.  Pay attention to your body when you bend and lift. Many people have less discomfortwhen lifting if they bend their knees, keep the load close to their bodies,and  avoid twisting. Often, the most comfortable positions are those that put less stress on your recovering back.  Find a comfortable position to sleep. Use a firm mattress and lie on your side with your knees slightly bent. If you lie on your back, put a pillow under your knees.  Only take over-the-counter or prescription medicines as directed by your caregiver. Over-the-counter medicines to reduce pain and inflammation are often the most helpful.Your caregiver may prescribe muscle relaxant drugs.These medicines help dull your pain so you can more quickly return to your normal activities and healthy exercise.  Put ice on the injured area.  Put ice in a plastic bag.  Place a towel between your skin and the bag.  Leave the ice on for 15-20 minutes, 03-04 times a day for the first 2 to 3 days. After that, ice and heat may be alternated to reduce pain and spasms.  Ask your caregiver about trying back exercises and gentle massage. This may be of some benefit.  Avoid feeling anxious or stressed.Stress increases muscle tension and can worsen back pain.It is important to recognize when you are anxious or stressed and learn ways to manage it.Exercise is a great option. SEEK MEDICAL CARE IF:  You have pain that is not relieved with rest or medicine.  You have pain that does not improve in 1 week.  You have new symptoms.  You are generally not feeling well. SEEK  IMMEDIATE MEDICAL CARE IF:   You have pain that radiates from your back into your legs.  You develop new bowel or bladder control problems.  You have unusual weakness or numbness in your arms or legs.  You develop nausea or vomiting.  You develop abdominal pain.  You feel faint. Document Released: 07/30/2005 Document Revised: 01/29/2012 Document Reviewed: 12/01/2013 [facility] Patient Information 2015 [place], [place]. This information is not intended to replace advice given to you by your health care provider. Make sure you  discuss any questions you have with your health care provider.  Lumbosacral Strain Lumbosacral strain is a strain of any of the parts that make up your lumbosacral vertebrae. Your lumbosacral vertebrae are the bones that make up the lower third of your backbone. Your lumbosacral vertebrae are held together by muscles and tough, fibrous tissue (ligaments).  CAUSES  A sudden blow to your back can cause lumbosacral strain. Also, anything that causes an excessive stretch of the muscles in the low back can cause this strain. This is typically seen when people exert themselves strenuously, fall, lift heavy objects, bend, or crouch repeatedly. RISK FACTORS  Physically demanding work.  Participation in pushing or pulling sports or sports that require a sudden twist of the back (tennis, golf, baseball).  Weight lifting.  Excessive lower back curvature.  Forward-tilted pelvis.  Weak back or abdominal muscles or both.  Tight hamstrings. SIGNS AND SYMPTOMS  Lumbosacral strain may cause pain in the area of your injury or pain that moves (radiates) down your leg.  DIAGNOSIS Your health care provider can often diagnose lumbosacral strain through a physical exam. In some cases, you may need tests such as X-ray exams.  TREATMENT  Treatment for your lower back injury depends on many factors that your clinician will have to evaluate. However, most treatment will include the use of anti-inflammatory medicines. HOME CARE INSTRUCTIONS   Avoid hard physical activities (tennis, racquetball, waterskiing) if you are not in proper physical condition for it. This may aggravate or create problems.  If you have a back problem, avoid sports requiring sudden body movements. Swimming and walking are generally safer activities.  Maintain good posture.  Maintain a healthy weight.  For acute conditions, you may put ice on the injured area.  Put ice in a plastic bag.  Place a towel between your skin and the  bag.  Leave the ice on for 20 minutes, 2-3 times a day.  When the low back starts healing, stretching and strengthening exercises may be recommended. SEEK MEDICAL CARE IF:  Your back pain is getting worse.  You experience severe back pain not relieved with medicines. SEEK IMMEDIATE MEDICAL CARE IF:   You have numbness, tingling, weakness, or problems with the use of your arms or legs.  There is a change in bowel or bladder control.  You have increasing pain in any area of the body, including your belly (abdomen).  You notice shortness of breath, dizziness, or feel faint.  You feel sick to your stomach (nauseous), are throwing up (vomiting), or become sweaty.  You notice discoloration of your toes or legs, or your feet get very cold. MAKE SURE YOU:   Understand these instructions.  Will watch your condition.  Will get help right away if you are not doing well or get worse. Document Released: 05/09/2005 Document Revised: 08/04/2013 Document Reviewed: 03/18/2013 [facility] Patient Information 2015 [place], [place]. This information is not intended to replace advice given to you by your health care provider.  Make sure you discuss any questions you have with your health care provider.  Muscle Strain A muscle strain (pulled muscle) happens when a muscle is stretched beyond normal length. It happens when a sudden, violent force stretches your muscle too far. Usually, a few of the fibers in your muscle are torn. Muscle strain is common in athletes. Recovery usually takes 1-2 weeks. Complete healing takes 5-6 weeks.  HOME CARE   Follow the PRICE method of treatment to help your injury get better. Do this the first 2-3 days after the injury:  Protect. Protect the muscle to keep it from getting injured again.  Rest. Limit your activity and rest the injured body part.  Ice. Put ice in a plastic bag. Place a towel between your skin and the bag. Then, apply the ice and leave it on from  15-20 minutes each hour. After the third day, switch to moist heat packs.  Compression. Use a splint or elastic bandage on the injured area for comfort. Do not put it on too tightly.  Elevate. Keep the injured body part above the level of your heart.  Only take medicine as told by your doctor.  Warm up before doing exercise to prevent future muscle strains. GET HELP IF:   You have more pain or puffiness (swelling) in the injured area.  You feel numbness, tingling, or notice a loss of strength in the injured area. MAKE SURE YOU:   Understand these instructions.  Will watch your condition.  Will get help right away if you are not doing well or get worse. Document Released: 05/08/2008 Document Revised: 05/20/2013 Document Reviewed: 02/26/2013 [facility] Patient Information 2015 [place]. This information is not intended to replace advice given to you by your health care provider. Make sure you discuss any questions you have with your health care provider.  Thoracic Strain Thoracic strain is an injury to the muscles of the upper back. A mild strain may take only 1 week to heal. Torn muscles or tendons may take 6 weeks to 2 months to heal. HOME CARE  Put ice on the injured area.  Put ice in a plastic bag.  Place a towel between your skin and the bag.  Leave the ice on for 15-20 minutes, 03-04 times a day, for the first 2 days.  Only take medicine as told by your doctor.  Go to physical therapy and perform exercises as told by your doctor.  Use wraps and back braces as told by your doctor.  Warm up before being active. GET HELP RIGHT AWAY IF:   There is more bruising, puffiness (swelling), or pain.  Medicine does not help the pain.  You have trouble breathing, chest pain, or a fever.  Your problems seem to be getting worse, not better. MAKE SURE YOU:   Understand these instructions.  Will watch your condition.  Will get help right away if you are not doing  well or get worse. Document Released: 01/16/2008 Document Revised: 10/22/2011 Document Reviewed: 09/18/2010 [facility] Patient Information 2015 [place], [place]. This information is not intended to replace advice given to you by your health care provider. Make sure you discuss any questions you have with your health care provider.

## 2014-12-06 NOTE — ED Provider Notes (Signed)
CSN: [id number]     Arrival date & time 12/06/14  1457 History   First MD Initiated Contact with Patient 12/06/14 1611     Chief Complaint  Patient presents with  . Back Pain   (Consider location/radiation/quality/duration/timing/severity/associated sxs/prior Treatment) HPI Comments: 44 year old male states that 4 days ago he awoke with burning in the lower left back and radiation of burning and pain to the left shoulder blade. He states he is unaware of any thing that he did have caused the pain. He did state that a couple days earlier he had mowed his girlfriend yard. He states the pain is located to the left lower back and radiates superiorly to the scapula and laterally to the midaxillary line. It is worse with movement, bending, twisting, pulling and especially with ambulation. He denies weakness or paresthesias to the lower extremities.   Past Medical History  Diagnosis Date  . Subdural hematoma   . Subarachnoid bleed   . Hypertension    Past Surgical History  Procedure Laterality Date  . Ankle surgery    . Hernia repair     History reviewed. No pertinent family history. History  Substance Use Topics  . Smoking status: Current Every Day Smoker -- 1.00 packs/day  . Smokeless tobacco: Not on file  . Alcohol Use: Yes     Comment: 6 pack of beer per day    Review of Systems  Constitutional: Positive for activity change. Negative for fever, appetite change and fatigue.  HENT: Negative.   Respiratory: Negative.   Gastrointestinal: Negative.   Genitourinary: Negative.   Musculoskeletal: Positive for myalgias and back pain. Negative for joint swelling, neck pain and neck stiffness.  Skin: Negative.   Neurological: Negative for dizziness, tremors, seizures, syncope, speech difficulty, weakness and headaches.  Psychiatric/Behavioral: Negative.     Allergies  Review of patient's allergies indicates no known allergies.  Home Medications   Prior to Admission medications    Medication Sig Start Date End Date Taking? Authorizing Provider  carbamide peroxide (DEBROX) 6.5 % otic solution Place 5 drops into the right ear 2 (two) times daily as needed. 05/21/14   Jennifer Piepenbrink, PA-C  cyclobenzaprine (FLEXERIL) 5 MG tablet Take 1 tablet (5 mg total) by mouth 3 (three) times daily as needed for muscle spasms. 12/06/14   Hayden Rasmussenavid Ladarrious Kirksey, NP  diclofenac (CATAFLAM) 50 MG tablet Take 1 tablet (50 mg total) by mouth 3 (three) times daily. One tablet TID with food prn pain. 12/06/14   Hayden Rasmussenavid Monetta Lick, NP  ibuprofen (ADVIL,MOTRIN) 800 MG tablet Take 1 tablet (800 mg total) by mouth 3 (three) times daily. 05/21/14   Jennifer Piepenbrink, PA-C  sodium chloride (OCEAN) 0.65 % SOLN nasal spray Place 1 spray into both nostrils as needed for congestion. 05/21/14   Jennifer Piepenbrink, PA-C  traMADol (ULTRAM) 50 MG tablet 1-2 tabs po q 6 hr prn pain Maximum dose= 8 tablets per day 12/06/14   Hayden Rasmussenavid Ileah Falkenstein, NP   BP 176/97 mmHg  Pulse 57  Temp(Src) 97.1 F (36.2 C) (Oral)  Resp 18  SpO2 97% Physical Exam  Constitutional: He is oriented to person, place, and time. He appears well-developed and well-nourished. No distress.  Eyes: EOM are normal. Pupils are equal, round, and reactive to light.  Neck: Normal range of motion.  Cardiovascular: Normal rate and regular rhythm.   Pulmonary/Chest: Effort normal and breath sounds normal. No respiratory distress.  Musculoskeletal: He exhibits no edema.  Tenderness along the left parathoracic and lumbar musculature as well  as the lateral ribs at the mid axillary line and posterior axillary line. No spinal tenderness or palpable or observed deformity. Nl ROM left shoulder.  Distal strength intact.   Lymphadenopathy:    He has no cervical adenopathy.  Neurological: He is alert and oriented to person, place, and time. He exhibits normal muscle tone.  Skin: Skin is warm and dry.  Psychiatric: He has a normal mood and affect.  Nursing note and vitals  reviewed.   ED Course  Procedures (including critical care time) Labs Review Labs Reviewed - No data to display  Imaging Review No results found.   MDM   1. Strain of lumbar paraspinal muscle, initial encounter   2. Strain of thoracic paraspinal muscles excluding T1 and T2 levels, initial encounter    Heat, stretches as demo'd Limit activities that cause pain Toradol 60 mg IM now cataflam 50 mg  Flexeril 5 mg Tramadol 50 mg #20    Hayden Rasmussen, NP 12/06/14 1658

## 2016-01-17 ENCOUNTER — Emergency Department (HOSPITAL_COMMUNITY)
Admission: EM | Admit: 2016-01-17 | Discharge: 2016-01-17 | Disposition: A | Discharge status: Home / Self Care | Payer: Self-pay | Attending: Emergency Medicine | Admitting: Emergency Medicine

## 2016-01-17 ENCOUNTER — Encounter (HOSPITAL_COMMUNITY): Payer: Self-pay | Admitting: Emergency Medicine

## 2016-01-17 DIAGNOSIS — F17200 Nicotine dependence, unspecified, uncomplicated: Secondary | ICD-10-CM | POA: Insufficient documentation

## 2016-01-17 DIAGNOSIS — Z23 Encounter for immunization: Secondary | ICD-10-CM | POA: Insufficient documentation

## 2016-01-17 DIAGNOSIS — I10 Essential (primary) hypertension: Secondary | ICD-10-CM | POA: Insufficient documentation

## 2016-01-17 DIAGNOSIS — R21 Rash and other nonspecific skin eruption: Secondary | ICD-10-CM | POA: Insufficient documentation

## 2016-01-17 MED ORDER — PREDNISONE 20 MG PO TABS
60.0000 mg | ORAL_TABLET | Freq: Once | ORAL | Status: AC
  Administered 2016-01-17: 60 mg via ORAL
  Filled 2016-01-17: qty 3

## 2016-01-17 MED ORDER — FAMOTIDINE 20 MG PO TABS: 20.0000 mg | ORAL_TABLET | Freq: Two times a day (BID) | ORAL | Status: AC

## 2016-01-17 MED ORDER — DIPHENHYDRAMINE HCL 25 MG PO CAPS
50.0000 mg | ORAL_CAPSULE | Freq: Once | ORAL | Status: AC
  Administered 2016-01-17: 50 mg via ORAL
  Filled 2016-01-17: qty 2

## 2016-01-17 MED ORDER — TETANUS-DIPHTH-ACELL PERTUSSIS 5-2.5-18.5 LF-MCG/0.5 IM SUSP
0.5000 mL | Freq: Once | INTRAMUSCULAR | Status: AC
  Administered 2016-01-17: 0.5 mL via INTRAMUSCULAR
  Filled 2016-01-17: qty 0.5

## 2016-01-17 MED ORDER — FAMOTIDINE 20 MG PO TABS
20.0000 mg | ORAL_TABLET | Freq: Once | ORAL | Status: AC
  Administered 2016-01-17: 20 mg via ORAL
  Filled 2016-01-17: qty 1

## 2016-01-17 MED ORDER — PREDNISONE 50 MG PO TABS: ORAL_TABLET | ORAL | Status: DC

## 2016-01-17 NOTE — ED Notes (Signed)
Patient c/o rash on bilat hands and left arm. Patient has one blister on left forearm that ruptured and then rash started spreading.  Patient state that rash burns.

## 2016-01-17 NOTE — Discharge Instructions (Signed)
1 to 2 tablets of 25 mg Benadryl pills every 4-6 hours as needed to a maximum of 300 mg per day. In addition, you may apply a topical hydrocortisone ointment to all affected areas except for the face.  ° °Do not hesitate to call 911 or return to the emergency room if you develop any shortness of breath, wheezing, tongue or lip swelling. ° °

## 2016-01-17 NOTE — ED Provider Notes (Signed)
CSN: [id number]     Arrival date & time 01/17/16  1050 History   First MD Initiated Contact with Patient 01/17/16 1111     Chief Complaint  Patient presents with  . Rash     (Consider location/radiation/quality/duration/timing/severity/associated sxs/prior Treatment) HPI  Blood pressure 167/105, pulse 59, temperature 97.7 F (36.5 C), temperature source Oral, resp. rate 19, SpO2 100 %.  Derek Austin is a 45 y.o. male past medical history significant for hypertension complaining of pruritic rash to bilateral hands onset 2 days ago after patient was helping someone cut down and clear a pine tree. He is no history of prior allergic reaction or anaphylactic reaction no new medications, pets, soaps or lotions. He is only been affected on his hands and feet or any oral mucosa. He denies shortness of breath or tongue swelling, nausea vomiting.    Past Medical History  Diagnosis Date  . Subdural hematoma (HCC)   . Subarachnoid bleed (HCC)   . Hypertension    Past Surgical History  Procedure Laterality Date  . Ankle surgery    . Hernia repair     No family history on file. Social History  Substance Use Topics  . Smoking status: Current Every Day Smoker -- 1.00 packs/day  . Smokeless tobacco: None  . Alcohol Use: Yes     Comment: 6 pack of beer per day    Review of Systems  10 systems reviewed and found to be negative, except as noted in the HPI.   Allergies  Review of patient's allergies indicates no known allergies.  Home Medications   Prior to Admission medications   Medication Sig Start Date End Date Taking? Authorizing Provider  carbamide peroxide (DEBROX) 6.5 % otic solution Place 5 drops into the right ear 2 (two) times daily as needed. 05/21/14   Jennifer Piepenbrink, PA-C  cyclobenzaprine (FLEXERIL) 5 MG tablet Take 1 tablet (5 mg total) by mouth 3 (three) times daily as needed for muscle spasms. 12/06/14   Hayden Rasmussen, NP  diclofenac (CATAFLAM) 50 MG tablet Take 1  tablet (50 mg total) by mouth 3 (three) times daily. One tablet TID with food prn pain. 12/06/14   Hayden Rasmussen, NP  famotidine (PEPCID) 20 MG tablet Take 1 tablet (20 mg total) by mouth 2 (two) times daily. 01/17/16   Bernarr Longsworth, PA-C  ibuprofen (ADVIL,MOTRIN) 800 MG tablet Take 1 tablet (800 mg total) by mouth 3 (three) times daily. 05/21/14   Francee Piccolo, PA-C  predniSONE (DELTASONE) 50 MG tablet Take 1 tablet daily with breakfast 01/17/16   Joni Reining Shaquan Missey, PA-C  sodium chloride (OCEAN) 0.65 % SOLN nasal spray Place 1 spray into both nostrils as needed for congestion. 05/21/14   Jennifer Piepenbrink, PA-C  traMADol (ULTRAM) 50 MG tablet 1-2 tabs po q 6 hr prn pain Maximum dose= 8 tablets per day 12/06/14   Hayden Rasmussen, NP   BP 167/105 mmHg  Pulse 59  Temp(Src) 97.7 F (36.5 C) (Oral)  Resp 19  SpO2 100% Physical Exam  Constitutional: He is oriented to person, place, and time. He appears well-developed and well-nourished. No distress.  HENT:  Head: Normocephalic.  Mouth/Throat: Oropharynx is clear and moist.  Eyes: Conjunctivae and EOM are normal. Pupils are equal, round, and reactive to light.  Cardiovascular: Normal rate, regular rhythm and intact distal pulses.   Pulmonary/Chest: Effort normal and breath sounds normal. No stridor. No respiratory distress. He has no wheezes. He has no rales. He exhibits no tenderness.  Abdominal: Soft.  Bowel sounds are normal. He exhibits no distension and no mass. There is no tenderness. There is no rebound and no guarding.  Musculoskeletal: Normal range of motion.  Neurological: He is alert and oriented to person, place, and time.  Skin: Rash noted.  2-5 mm scattered excoriated blisters to bilateral hand dorsum with no surrounding warmth, cellulitis or discharge. Lesions spare the palms, soles and mucous membranes.  Psychiatric: He has a normal mood and affect.  Nursing note and vitals reviewed.   ED Course  Procedures (including critical  care time) Labs Review Labs Reviewed - No data to display  Imaging Review No results found. I have personally reviewed and evaluated these images and lab results as part of my medical decision-making.   EKG Interpretation None      MDM   Final diagnoses:  Rash and nonspecific skin eruption    Filed Vitals:   01/17/16 1057  BP: 167/105  Pulse: 59  Temp: 97.7 F (36.5 C)  TempSrc: Oral  Resp: 19  SpO2: 100%    Medications  predniSONE (DELTASONE) tablet 60 mg (60 mg Oral Given 01/17/16 1138)  diphenhydrAMINE (BENADRYL) capsule 50 mg (50 mg Oral Given 01/17/16 1138)  famotidine (PEPCID) tablet 20 mg (20 mg Oral Given 01/17/16 1139)  Tdap (BOOSTRIX) injection 0.5 mL (0.5 mLs Intramuscular Given 01/17/16 1141)    Derek Austin is 45 y.o. male presenting with Blistering and pruritus to his last SPECT of bilateral hands onset 2 days ago. He's been applying calamine lotion with little relief. No signs of secondary infection, lesions do not affect the palms, soles or mucous membranes. This is likely a contact dermatitis from clearing brush several days ago. Patient will be given prednisone burst, Pepcid and recommend Benadryl. We've had an extensive discussion of wound care and return precautions, some of his blisters have opened, will update tetanus.   Evaluation does not show pathology that would require ongoing emergent intervention or inpatient treatment. Pt is hemodynamically stable and mentating appropriately. Discussed findings and plan with patient/guardian, who agrees with care plan. All questions answered. Return precautions discussed and outpatient follow up given.   Discharge Medication List as of 01/17/2016 11:30 AM    START taking these medications   Details  famotidine (PEPCID) 20 MG tablet Take 1 tablet (20 mg total) by mouth 2 (two) times daily., Starting 01/17/2016, Until Discontinued, Print    predniSONE (DELTASONE) 50 MG tablet Take 1 tablet daily with breakfast, Print              Wynetta Emeryicole Montez Stryker, PA-C 01/17/16 1353  Leta BaptistEmily Roe Nguyen, MD 01/23/16 2312

## 2016-03-09 ENCOUNTER — Emergency Department (HOSPITAL_COMMUNITY)
Admission: EM | Admit: 2016-03-09 | Discharge: 2016-03-09 | Disposition: A | Discharge status: Home / Self Care | Payer: Self-pay | Attending: Emergency Medicine | Admitting: Emergency Medicine

## 2016-03-09 ENCOUNTER — Encounter (HOSPITAL_COMMUNITY): Payer: Self-pay | Admitting: Emergency Medicine

## 2016-03-09 ENCOUNTER — Emergency Department (HOSPITAL_COMMUNITY): Payer: Self-pay

## 2016-03-09 DIAGNOSIS — R109 Unspecified abdominal pain: Secondary | ICD-10-CM | POA: Insufficient documentation

## 2016-03-09 DIAGNOSIS — R739 Hyperglycemia, unspecified: Secondary | ICD-10-CM | POA: Insufficient documentation

## 2016-03-09 DIAGNOSIS — F17200 Nicotine dependence, unspecified, uncomplicated: Secondary | ICD-10-CM | POA: Insufficient documentation

## 2016-03-09 DIAGNOSIS — E748 Other specified disorders of carbohydrate metabolism: Secondary | ICD-10-CM | POA: Insufficient documentation

## 2016-03-09 DIAGNOSIS — I10 Essential (primary) hypertension: Secondary | ICD-10-CM | POA: Insufficient documentation

## 2016-03-09 DIAGNOSIS — R81 Glycosuria: Secondary | ICD-10-CM

## 2016-03-09 DIAGNOSIS — R319 Hematuria, unspecified: Secondary | ICD-10-CM | POA: Insufficient documentation

## 2016-03-09 DIAGNOSIS — Z79899 Other long term (current) drug therapy: Secondary | ICD-10-CM | POA: Insufficient documentation

## 2016-03-09 LAB — URINALYSIS, ROUTINE W REFLEX MICROSCOPIC
Bilirubin Urine: NEGATIVE
KETONES UR: 40 mg/dL — AB
LEUKOCYTES UA: NEGATIVE
NITRITE: NEGATIVE
PH: 5 (ref 5.0–8.0)
Protein, ur: NEGATIVE mg/dL
SPECIFIC GRAVITY, URINE: 1.031 — AB (ref 1.005–1.030)

## 2016-03-09 LAB — COMPREHENSIVE METABOLIC PANEL
ALBUMIN: 4.3 g/dL (ref 3.5–5.0)
ALK PHOS: 84 U/L (ref 38–126)
ALT: 46 U/L (ref 17–63)
ANION GAP: 11 (ref 5–15)
AST: 66 U/L — AB (ref 15–41)
BILIRUBIN TOTAL: 0.5 mg/dL (ref 0.3–1.2)
BUN: 16 mg/dL (ref 6–20)
CALCIUM: 9.3 mg/dL (ref 8.9–10.3)
CO2: 22 mmol/L (ref 22–32)
Chloride: 103 mmol/L (ref 101–111)
Creatinine, Ser: 1.07 mg/dL (ref 0.61–1.24)
GFR calc Af Amer: >60 mL/min (ref >60)
GFR calc non Af Amer: >60 mL/min (ref >60)
GLUCOSE: 229 mg/dL — AB (ref 65–99)
Potassium: 3.7 mmol/L (ref 3.5–5.1)
SODIUM: 136 mmol/L (ref 135–145)
TOTAL PROTEIN: 7.8 g/dL (ref 6.5–8.1)

## 2016-03-09 LAB — URINE MICROSCOPIC-ADD ON

## 2016-03-09 LAB — CBC
HEMATOCRIT: 42.6 % (ref 39.0–52.0)
HEMOGLOBIN: 14 g/dL (ref 13.0–17.0)
MCH: 32.4 pg (ref 26.0–34.0)
MCHC: 32.9 g/dL (ref 30.0–36.0)
MCV: 98.6 fL (ref 78.0–100.0)
Platelets: 228 10*3/uL (ref 150–400)
RBC: 4.32 MIL/uL (ref 4.22–5.81)
RDW: 13.4 % (ref 11.5–15.5)
WBC: 10 10*3/uL (ref 4.0–10.5)

## 2016-03-09 LAB — LIPASE, BLOOD: Lipase: 27 U/L (ref 11–51)

## 2016-03-09 MED ORDER — ONDANSETRON HCL 4 MG/2ML IJ SOLN
4.0000 mg | Freq: Once | INTRAMUSCULAR | Status: AC
  Administered 2016-03-09: 4 mg via INTRAVENOUS
  Filled 2016-03-09: qty 2

## 2016-03-09 MED ORDER — SODIUM CHLORIDE 0.9 % IV BOLUS (SEPSIS)
1000.0000 mL | Freq: Once | INTRAVENOUS | Status: AC
  Administered 2016-03-09: 1000 mL via INTRAVENOUS

## 2016-03-09 MED ORDER — OXYCODONE-ACETAMINOPHEN 5-325 MG PO TABS
ORAL_TABLET | ORAL | Status: AC
  Filled 2016-03-09: qty 1

## 2016-03-09 MED ORDER — KETOROLAC TROMETHAMINE 30 MG/ML IJ SOLN
30.0000 mg | Freq: Once | INTRAMUSCULAR | Status: AC
  Administered 2016-03-09: 30 mg via INTRAVENOUS
  Filled 2016-03-09: qty 1

## 2016-03-09 MED ORDER — HYDROMORPHONE HCL 1 MG/ML IJ SOLN
1.0000 mg | Freq: Once | INTRAMUSCULAR | Status: AC
  Administered 2016-03-09: 1 mg via INTRAVENOUS
  Filled 2016-03-09: qty 1

## 2016-03-09 MED ORDER — OXYCODONE-ACETAMINOPHEN 5-325 MG PO TABS
1.0000 | ORAL_TABLET | ORAL | Status: DC | PRN
  Administered 2016-03-09: 1 via ORAL

## 2016-03-09 MED ORDER — NAPROXEN 500 MG PO TABS: 500.0000 mg | ORAL_TABLET | Freq: Two times a day (BID) | ORAL | 0 refills | Status: DC

## 2016-03-09 NOTE — Discharge Instructions (Signed)
Call [facility] to schedule a follow up appointment. Please have them recheck your urine and metabolic panel as I am concerned you might be developing diabetes or pre-diabetes.

## 2016-03-09 NOTE — ED Provider Notes (Signed)
[facility]-EMERGENCY DEPT Provider Note   CSN: [id number] Arrival date & time: 03/09/16  1435  First Provider Contact:  First MD Initiated Contact with Patient 03/09/16 1909     History   Chief Complaint Chief Complaint  Patient presents with  . Flank Pain    HPI  Derek Austin is an 45 y.o. male with history of HTN and SAH who presents to the ED for evaluation of right flank pain. He reports right flank pain for the past week. The pain is constant and progressing in severity. He was given a percocet in triage with minimal relief. He rates his pain an 8/10 currently. He states he initially had gross hematuria which resolved after taking cranberry pills. He states he still has some mild dysuria. Endorses intermittent nausea without emesis. Denies diarrhea. He is unsure if he has been febrile at home. Denies h/o kidney stones.  Past Medical History:  Diagnosis Date  . Hypertension   . Subarachnoid bleed (HCC)   . Subdural hematoma (HCC)     There are no active problems to display for this patient.   Past Surgical History:  Procedure Laterality Date  . ANKLE SURGERY    . HERNIA REPAIR         Home Medications    Prior to Admission medications   Medication Sig Start Date End Date Taking? Authorizing Provider  carbamide peroxide (DEBROX) 6.5 % otic solution Place 5 drops into the right ear 2 (two) times daily as needed. 05/21/14   Jennifer Piepenbrink, PA-C  cyclobenzaprine (FLEXERIL) 5 MG tablet Take 1 tablet (5 mg total) by mouth 3 (three) times daily as needed for muscle spasms. 12/06/14   Hayden Rasmussen, NP  diclofenac (CATAFLAM) 50 MG tablet Take 1 tablet (50 mg total) by mouth 3 (three) times daily. One tablet TID with food prn pain. 12/06/14   Hayden Rasmussen, NP  famotidine (PEPCID) 20 MG tablet Take 1 tablet (20 mg total) by mouth 2 (two) times daily. 01/17/16   Nicole Pisciotta, PA-C  ibuprofen (ADVIL,MOTRIN) 800 MG tablet Take 1 tablet (800 mg total) by mouth 3 (three) times daily.  05/21/14   Francee Piccolo, PA-C  predniSONE (DELTASONE) 50 MG tablet Take 1 tablet daily with breakfast 01/17/16   Joni Reining Pisciotta, PA-C  sodium chloride (OCEAN) 0.65 % SOLN nasal spray Place 1 spray into both nostrils as needed for congestion. 05/21/14   Francee Piccolo, PA-C  traMADol (ULTRAM) 50 MG tablet 1-2 tabs po q 6 hr prn pain Maximum dose= 8 tablets per day 12/06/14   Hayden Rasmussen, NP    Family History History reviewed. No pertinent family history.  Social History Social History  Substance Use Topics  . Smoking status: Current Every Day Smoker    Packs/day: 1.00  . Smokeless tobacco: Never Used  . Alcohol use Yes     Comment: 6 pack of beer per day     Allergies   Review of patient's allergies indicates no known allergies.   Review of Systems Review of Systems 10 Systems reviewed and are negative for acute change except as noted in the HPI.   Physical Exam Updated Vital Signs BP 164/100 (BP Location: Left Arm)   Pulse (!) 58   Temp 97.7 F (36.5 C) (Oral)   Resp 14   SpO2 99%   Physical Exam  Constitutional: He is oriented to person, place, and time.  Appears uncomfortable, NAD  HENT:  Right Ear: External ear normal.  Left Ear: External ear normal.  Nose: Nose normal.  Mouth/Throat: Oropharynx is clear and moist. No oropharyngeal exudate.  Eyes: Conjunctivae and EOM are normal. Pupils are equal, round, and reactive to light.  Neck: Normal range of motion. Neck supple.  Cardiovascular: Normal rate, regular rhythm, normal heart sounds and intact distal pulses.   Pulmonary/Chest: Effort normal and breath sounds normal. No respiratory distress. He has no wheezes. He exhibits no tenderness.  Abdominal: Soft. Bowel sounds are normal. He exhibits no distension. There is tenderness. There is no rebound and no guarding.  Mild diffuse right sided abdominal tenderness.  Marked R CVA tenderness  Musculoskeletal: He exhibits no edema.  Neurological: He is alert  and oriented to person, place, and time. No cranial nerve deficit.  Skin: Skin is warm and dry.  Psychiatric: He has a normal mood and affect.  Nursing note and vitals reviewed.    ED Treatments / Results  Labs (all labs ordered are listed, but only abnormal results are displayed) Labs Reviewed  URINALYSIS, ROUTINE W REFLEX MICROSCOPIC (NOT AT [facility]) - Abnormal; Notable for the following:       Result Value   Specific Gravity, Urine 1.031 (*)    Glucose, UA >1000 (*)    Hgb urine dipstick SMALL (*)    Ketones, ur 40 (*)    All other components within normal limits  COMPREHENSIVE METABOLIC PANEL - Abnormal; Notable for the following:    Glucose, Bld 229 (*)    AST 66 (*)    All other components within normal limits  URINE MICROSCOPIC-ADD ON - Abnormal; Notable for the following:    Squamous Epithelial / LPF 0-5 (*)    Bacteria, UA RARE (*)    Casts HYALINE CASTS (*)    All other components within normal limits  LIPASE, BLOOD  CBC    EKG  EKG Interpretation None       Radiology Ct Renal Stone Study  Result Date: 03/09/2016 CLINICAL DATA:  Right-sided flank pain EXAM: CT ABDOMEN AND PELVIS WITHOUT CONTRAST TECHNIQUE: Multidetector CT imaging of the abdomen and pelvis was performed following the standard protocol without IV contrast. COMPARISON:  05/04/2006 FINDINGS: Lower chest:  No acute findings. Hepatobiliary: No mass visualized on this un-enhanced exam. Pancreas: No mass or inflammatory process identified on this un-enhanced exam. A few calcifications are noted in the region of the pancreatic head likely related to prior pancreatic inflammatory change. Spleen: Within normal limits in size. Adrenals/Urinary Tract: No evidence of urolithiasis or hydronephrosis. No definite mass visualized on this un-enhanced exam. The bladder is well distended. Stomach/Bowel: No evidence of obstruction, inflammatory process, or abnormal fluid collections. The appendix is within normal limits.  Vascular/Lymphatic: Mild aortic calcifications are noted without aneurysmal dilatation. No definitive lymphadenopathy is seen. Reproductive: No mass or other significant abnormality. Other: None. Musculoskeletal:  No suspicious bone lesions identified. IMPRESSION: No acute abnormality noted. Electronically Signed   By: Alcide Clever M.D.   On: 03/09/2016 20:19   Procedures Procedures (including critical care time)  Medications Ordered in ED Medications  oxyCODONE-acetaminophen (PERCOCET/ROXICET) 5-325 MG per tablet 1 tablet (1 tablet Oral Given 03/09/16 1500)  sodium chloride 0.9 % bolus 1,000 mL (not administered)  HYDROmorphone (DILAUDID) injection 1 mg (not administered)  ondansetron (ZOFRAN) injection 4 mg (not administered)     Initial Impression / Assessment and Plan / ED Course  I have reviewed the triage vital signs and the nursing notes.  Pertinent labs & imaging results that were available during my care of the patient  were reviewed by me and considered in my medical decision making (see chart for details).  Suspect kidney stone. Pt with R flank pain and urinary symptoms x 1 week. Urine here with hemoglobin but no infection. Glucosuria is new. Pt states he has never had h/o hyperglycemia or DM. CBC and lipase negative. Abdominal exam significant for R CVA tenderness. Lower suspicion for appendicitis, cholecystitis, SBO, diverticulitis, AAA, or other acute intra-abdominal process. Will obtain CT renal study. Will hydrate and provide IV pain meds.   Pain improved in the ED. On repeat exam pt's tenderness much improved. His CT renal study reveals no acute abnormalities. I gave pt resources to establish PCP for follow up. Encouraged him to have PCP monitor his metabolic panel and urine as he is hyperglycemic today with glucose/ketones in his urine though no anion gap, as well as to monitor hematuria. ER return precautions given.  Final Clinical Impressions(s) / ED Diagnoses   Final  diagnoses:  Right flank pain  Hematuria  Glucosuria  Hyperglycemia    New Prescriptions Discharge Medication List as of 03/09/2016  8:27 PM    START taking these medications   Details  naproxen (NAPROSYN) 500 MG tablet Take 1 tablet (500 mg total) by mouth 2 (two) times daily., Starting Fri 03/09/2016, Print         Carlene Coria, PA-C 03/10/16 1021    Maia Plan, MD 03/10/16 1030

## 2016-03-09 NOTE — ED Triage Notes (Signed)
Pt from home with c/o right flank pain associated with bloody urine and burning with urination.  Pt reports taking cranberry pills which helped stopped the blood in urine but the burning is still present.  Hx of UTI, no hx of kidney stone.  NAD, A&O.

## 2017-05-22 ENCOUNTER — Encounter (HOSPITAL_COMMUNITY): Payer: Self-pay | Admitting: Emergency Medicine

## 2017-05-22 ENCOUNTER — Emergency Department (HOSPITAL_COMMUNITY): Payer: Self-pay

## 2017-05-22 ENCOUNTER — Emergency Department (HOSPITAL_COMMUNITY)
Admission: EM | Admit: 2017-05-22 | Discharge: 2017-05-22 | Disposition: A | Discharge status: Home / Self Care | Payer: Self-pay | Attending: Emergency Medicine | Admitting: Emergency Medicine

## 2017-05-22 DIAGNOSIS — R112 Nausea with vomiting, unspecified: Secondary | ICD-10-CM | POA: Insufficient documentation

## 2017-05-22 DIAGNOSIS — F17200 Nicotine dependence, unspecified, uncomplicated: Secondary | ICD-10-CM | POA: Insufficient documentation

## 2017-05-22 DIAGNOSIS — I10 Essential (primary) hypertension: Secondary | ICD-10-CM | POA: Insufficient documentation

## 2017-05-22 DIAGNOSIS — J181 Lobar pneumonia, unspecified organism: Secondary | ICD-10-CM

## 2017-05-22 DIAGNOSIS — J189 Pneumonia, unspecified organism: Secondary | ICD-10-CM | POA: Insufficient documentation

## 2017-05-22 LAB — COMPREHENSIVE METABOLIC PANEL
ALBUMIN: 4.1 g/dL (ref 3.5–5.0)
ALK PHOS: 75 U/L (ref 38–126)
ALT: 17 U/L (ref 17–63)
ANION GAP: 15 (ref 5–15)
AST: 29 U/L (ref 15–41)
BILIRUBIN TOTAL: 0.8 mg/dL (ref 0.3–1.2)
BUN: 12 mg/dL (ref 6–20)
CALCIUM: 9.1 mg/dL (ref 8.9–10.3)
CO2: 18 mmol/L — AB (ref 22–32)
Chloride: 101 mmol/L (ref 101–111)
Creatinine, Ser: 0.86 mg/dL (ref 0.61–1.24)
GFR calc non Af Amer: >60 mL/min (ref >60)
Glucose, Bld: 98 mg/dL (ref 65–99)
POTASSIUM: 3.6 mmol/L (ref 3.5–5.1)
SODIUM: 134 mmol/L — AB (ref 135–145)
TOTAL PROTEIN: 7.5 g/dL (ref 6.5–8.1)

## 2017-05-22 LAB — CBC
HEMATOCRIT: 42 % (ref 39.0–52.0)
HEMOGLOBIN: 14.6 g/dL (ref 13.0–17.0)
MCH: 33.1 pg (ref 26.0–34.0)
MCHC: 34.8 g/dL (ref 30.0–36.0)
MCV: 95.2 fL (ref 78.0–100.0)
Platelets: 190 10*3/uL (ref 150–400)
RBC: 4.41 MIL/uL (ref 4.22–5.81)
RDW: 12.8 % (ref 11.5–15.5)
WBC: 13.7 10*3/uL — ABNORMAL HIGH (ref 4.0–10.5)

## 2017-05-22 LAB — URINALYSIS, ROUTINE W REFLEX MICROSCOPIC
BACTERIA UA: NONE SEEN
Bilirubin Urine: NEGATIVE
GLUCOSE, UA: NEGATIVE mg/dL
Ketones, ur: 5 mg/dL — AB
Leukocytes, UA: NEGATIVE
NITRITE: NEGATIVE
PH: 5 (ref 5.0–8.0)
Protein, ur: NEGATIVE mg/dL
Specific Gravity, Urine: 1.013 (ref 1.005–1.030)
Squamous Epithelial / LPF: NONE SEEN

## 2017-05-22 LAB — LIPASE, BLOOD: Lipase: 29 U/L (ref 11–51)

## 2017-05-22 MED ORDER — HYDROMORPHONE HCL 1 MG/ML IJ SOLN
1.0000 mg | Freq: Once | INTRAMUSCULAR | Status: AC
  Administered 2017-05-22: 1 mg via INTRAVENOUS
  Filled 2017-05-22: qty 1

## 2017-05-22 MED ORDER — LEVOFLOXACIN 750 MG PO TABS: 750.0000 mg | ORAL_TABLET | Freq: Every day | ORAL | 0 refills | Status: DC

## 2017-05-22 MED ORDER — ONDANSETRON HCL 4 MG/2ML IJ SOLN
4.0000 mg | Freq: Once | INTRAMUSCULAR | Status: AC
  Administered 2017-05-22: 4 mg via INTRAVENOUS
  Filled 2017-05-22: qty 2

## 2017-05-22 MED ORDER — IOPAMIDOL (ISOVUE-300) INJECTION 61%
INTRAVENOUS | Status: AC
  Administered 2017-05-22: 100 mL
  Filled 2017-05-22: qty 100

## 2017-05-22 MED ORDER — LEVOFLOXACIN 750 MG PO TABS
750.0000 mg | ORAL_TABLET | Freq: Once | ORAL | Status: AC
  Administered 2017-05-22: 750 mg via ORAL
  Filled 2017-05-22: qty 1

## 2017-05-22 MED ORDER — ONDANSETRON 4 MG PO TBDP
ORAL_TABLET | ORAL | Status: AC
  Filled 2017-05-22: qty 1

## 2017-05-22 MED ORDER — SODIUM CHLORIDE 0.9 % IV BOLUS (SEPSIS)
1000.0000 mL | Freq: Once | INTRAVENOUS | Status: AC
  Administered 2017-05-22: 1000 mL via INTRAVENOUS

## 2017-05-22 MED ORDER — ONDANSETRON 4 MG PO TBDP
4.0000 mg | ORAL_TABLET | Freq: Once | ORAL | Status: AC
  Administered 2017-05-22: 4 mg via ORAL

## 2017-05-22 NOTE — ED Provider Notes (Signed)
[facility]ER[facility] DEPT Provider Note   CSN: [id number] Arrival date & time: 05/22/17  0108     History   Chief Complaint Chief Complaint  Patient presents with  . Fever  . Nausea  . Emesis    HPI Derek Austin is a 46 y.o. male.  Patient has been experiencing feverand left-sided abdominal pain. Symptoms ongoing for several days. He has had nausea and vomiting. Patient now developing cough productive of sputum.      Past Medical History:  Diagnosis Date  . Hypertension   . Subarachnoid bleed (HCC)   . Subdural hematoma (HCC)     There are no active problems to display for this patient.   Past Surgical History:  Procedure Laterality Date  . ANKLE SURGERY    . HERNIA REPAIR         Home Medications    Prior to Admission medications   Medication Sig Start Date End Date Taking? Authorizing Provider  Phenyleph-Doxylamine-DM-APAP (NYQUIL SEVERE COLD/FLU) 5-6.25-10-325 MG/15ML LIQD Take 15 mLs by mouth as needed (for cold and flu).   Yes [provider]  carbamide peroxide (DEBROX) 6.5 % otic solution Place 5 drops into the right ear 2 (two) times daily as needed. Patient not taking: Reported on 05/22/2017 05/21/14   Piepenbrink, Victorino Dike, PA-C  cyclobenzaprine (FLEXERIL) 5 MG tablet Take 1 tablet (5 mg total) by mouth 3 (three) times daily as needed for muscle spasms. Patient not taking: Reported on 05/22/2017 12/06/14   Hayden Rasmussen, NP  diclofenac (CATAFLAM) 50 MG tablet Take 1 tablet (50 mg total) by mouth 3 (three) times daily. One tablet TID with food prn pain. Patient not taking: Reported on 05/22/2017 12/06/14   Hayden Rasmussen, NP  famotidine (PEPCID) 20 MG tablet Take 1 tablet (20 mg total) by mouth 2 (two) times daily. Patient not taking: Reported on 05/22/2017 01/17/16   Pisciotta, Joni Reining, PA-C  ibuprofen (ADVIL,MOTRIN) 800 MG tablet Take 1 tablet (800 mg total) by mouth 3 (three) times daily. Patient not taking: Reported on 05/22/2017 05/21/14    Piepenbrink, Victorino Dike, PA-C  levofloxacin (LEVAQUIN) 750 MG tablet Take 1 tablet (750 mg total) by mouth daily. 05/22/17   Gilda Crease, MD  naproxen (NAPROSYN) 500 MG tablet Take 1 tablet (500 mg total) by mouth 2 (two) times daily. Patient not taking: Reported on 05/22/2017 03/09/16   Sam, Ace Gins, PA-C  predniSONE (DELTASONE) 50 MG tablet Take 1 tablet daily with breakfast Patient not taking: Reported on 05/22/2017 01/17/16   Pisciotta, Joni Reining, PA-C  sodium chloride (OCEAN) 0.65 % SOLN nasal spray Place 1 spray into both nostrils as needed for congestion. Patient not taking: Reported on 05/22/2017 05/21/14   Piepenbrink, Victorino Dike, PA-C  traMADol (ULTRAM) 50 MG tablet 1-2 tabs po q 6 hr prn pain Maximum dose= 8 tablets per day Patient not taking: Reported on 05/22/2017 12/06/14   Hayden Rasmussen, NP    Family History No family history on file.  Social History Social History  Substance Use Topics  . Smoking status: Current Every Day Smoker    Packs/day: 1.00  . Smokeless tobacco: Never Used  . Alcohol use Yes     Comment: 6 pack of beer per day     Allergies   Patient has no known allergies.   Review of Systems Review of Systems  Constitutional: Positive for chills and fever.  Respiratory: Positive for cough.   Gastrointestinal: Positive for abdominal pain.  All other systems reviewed and are negative.    Physical  Exam Updated Vital Signs BP (!) 143/92   Pulse (!) 110   Temp 99.2 F (37.3 C) (Oral)   Resp 17   SpO2 96%   Physical Exam  Constitutional: He is oriented to person, place, and time. He appears well-developed and well-nourished. No distress.  HENT:  Head: Normocephalic and atraumatic.  Right Ear: Hearing normal.  Left Ear: Hearing normal.  Nose: Nose normal.  Mouth/Throat: Oropharynx is clear and moist and mucous membranes are normal.  Eyes: Pupils are equal, round, and reactive to light. Conjunctivae and EOM are normal.  Neck: Normal range of  motion. Neck supple.  Cardiovascular: Regular rhythm, S1 normal and S2 normal.  Exam reveals no gallop and no friction rub.   No murmur heard. Pulmonary/Chest: Effort normal and breath sounds normal. No respiratory distress. He exhibits no tenderness.  Abdominal: Soft. Normal appearance and bowel sounds are normal. There is no hepatosplenomegaly. There is tenderness in the epigastric area and left upper quadrant. There is no rebound, no guarding, no tenderness at McBurney's point and negative Murphy's sign. No hernia.  Musculoskeletal: Normal range of motion.  Neurological: He is alert and oriented to person, place, and time. He has normal strength. No cranial nerve deficit or sensory deficit. Coordination normal. GCS eye subscore is 4. GCS verbal subscore is 5. GCS motor subscore is 6.  Skin: Skin is warm, dry and intact. No rash noted. No cyanosis.  Psychiatric: He has a normal mood and affect. His speech is normal and behavior is normal. Thought content normal.  Nursing note and vitals reviewed.    ED Treatments / Results  Labs (all labs ordered are listed, but only abnormal results are displayed) Labs Reviewed  COMPREHENSIVE METABOLIC PANEL - Abnormal; Notable for the following:       Result Value   Sodium 134 (*)    CO2 18 (*)    All other components within normal limits  CBC - Abnormal; Notable for the following:    WBC 13.7 (*)    All other components within normal limits  URINALYSIS, ROUTINE W REFLEX MICROSCOPIC - Abnormal; Notable for the following:    Hgb urine dipstick SMALL (*)    Ketones, ur 5 (*)    All other components within normal limits  LIPASE, BLOOD    EKG  EKG Interpretation None       Radiology Dg Chest 2 View  Result Date: 05/22/2017 CLINICAL DATA:  Acute onset of cough and fever.  Initial encounter. EXAM: CHEST  2 VIEW COMPARISON:  Chest radiograph performed 05/04/2006 FINDINGS: The lungs are well-aerated. Left lower lobe airspace opacity is  compatible with pneumonia. There is no evidence of pleural effusion or pneumothorax. The heart is normal in size; the mediastinal contour is within normal limits. No acute osseous abnormalities are seen. IMPRESSION: Left lower lobe pneumonia. Followup PA and lateral chest X-ray is recommended in 3-4 weeks following trial of antibiotic therapy to ensure resolution and exclude underlying malignancy. Electronically Signed   By: Roanna Raider M.D.   On: 05/22/2017 05:09   Ct Abdomen Pelvis W Contrast  Result Date: 05/22/2017 CLINICAL DATA:  Acute onset of left-sided chest pain and fever, nausea and hematemesis. Initial encounter. EXAM: CT ABDOMEN AND PELVIS WITH CONTRAST TECHNIQUE: Multidetector CT imaging of the abdomen and pelvis was performed using the standard protocol following bolus administration of intravenous contrast. CONTRAST:  ISOVUE-300 IOPAMIDOL (ISOVUE-300) INJECTION 61% COMPARISON:  CT of the abdomen and pelvis performed 03/09/2016 FINDINGS: Lower chest: Dense  left lower lobe airspace opacity is compatible with pneumonia. The visualized portions of the mediastinum are unremarkable. Hepatobiliary: The liver is unremarkable in appearance. The gallbladder is unremarkable in appearance. The common bile duct remains normal in caliber. Pancreas: The pancreas is within normal limits. A few small calcified nodes are seen adjacent to the pancreas. Spleen: The spleen is unremarkable in appearance. Adrenals/Urinary Tract: The adrenal glands are unremarkable in appearance. A small right renal cyst is noted. There is no evidence of hydronephrosis. No renal or ureteral stones are identified. No perinephric stranding is seen. Stomach/Bowel: Apparent wall thickening along the body of the stomach may simply reflect decompression. However, if the patient's hematemesis persists, endoscopy could be considered to exclude underlying mass. The small bowel is within normal limits. The appendix is normal in caliber,  without evidence of appendicitis. The colon is unremarkable in appearance. Vascular/Lymphatic: Scattered calcification is seen along the distal abdominal aorta. The abdominal aorta is otherwise grossly unremarkable. The inferior vena cava is grossly unremarkable. No retroperitoneal lymphadenopathy is seen. No pelvic sidewall lymphadenopathy is identified. Reproductive: The bladder is mildly distended. The prostate remains normal in size. Other: No additional soft tissue abnormalities are seen. Musculoskeletal: No acute osseous abnormalities are identified. The visualized musculature is unremarkable in appearance. IMPRESSION: 1. Dense left lower lobe pneumonia noted. 2. Apparent wall thickening along the body of the stomach may simply reflect decompression. However, if the patient's hematemesis persists, endoscopy could be considered to exclude underlying mass. 3. Scattered aortic atherosclerosis. 4. Few small calcified nodes adjacent to the pancreas may reflect remote granulomatous disease. Electronically Signed   By: Roanna Raider M.D.   On: 05/22/2017 05:43    Procedures Procedures (including critical care time)  Medications Ordered in ED Medications  levofloxacin (LEVAQUIN) tablet 750 mg (not administered)  ondansetron (ZOFRAN-ODT) disintegrating tablet 4 mg (4 mg Oral Given 05/22/17 0122)  sodium chloride 0.9 % bolus 1,000 mL (0 mLs Intravenous Stopped 05/22/17 0647)  ondansetron (ZOFRAN) injection 4 mg (4 mg Intravenous Given 05/22/17 0416)  HYDROmorphone (DILAUDID) injection 1 mg (1 mg Intravenous Given 05/22/17 0416)  iopamidol (ISOVUE-300) 61 % injection (100 mLs  Contrast Given 05/22/17 0509)     Initial Impression / Assessment and Plan / ED Course  I have reviewed the triage vital signs and the nursing notes.  Pertinent labs & imaging results that were available during my care of the patient were reviewed by me and considered in my medical decision making (see chart for details).       Patient presents with complaints of fever, chills, cough and abdominal pain. He did have a leukocytosis, remainder of lab work was unremarkable. Imaging was performed he has evidence of left lower lobepneumonia. Patient will be treated with Levaquin.  Final Clinical Impressions(s) / ED Diagnoses   Final diagnoses:  Community acquired pneumonia of left lower lobe of lung (HCC)    New Prescriptions New Prescriptions   LEVOFLOXACIN (LEVAQUIN) 750 MG TABLET    Take 1 tablet (750 mg total) by mouth daily.     Gilda Crease, MD 05/22/17 [phone]

## 2017-05-22 NOTE — ED Triage Notes (Signed)
Patient with fever, abdominal pain and not feeling well.  Patient states that this has been going on for about a few days.

## 2019-11-19 ENCOUNTER — Other Ambulatory Visit: Payer: Self-pay

## 2019-11-19 ENCOUNTER — Encounter (HOSPITAL_COMMUNITY): Payer: Self-pay | Admitting: *Deleted

## 2019-11-19 ENCOUNTER — Emergency Department (HOSPITAL_COMMUNITY): Payer: Medicaid Other

## 2019-11-19 ENCOUNTER — Emergency Department (HOSPITAL_COMMUNITY)
Admission: EM | Admit: 2019-11-19 | Discharge: 2019-11-19 | Disposition: A | Discharge status: Home / Self Care | Payer: Medicaid Other | Attending: Emergency Medicine | Admitting: Emergency Medicine

## 2019-11-19 DIAGNOSIS — Y999 Unspecified external cause status: Secondary | ICD-10-CM | POA: Insufficient documentation

## 2019-11-19 DIAGNOSIS — Y9389 Activity, other specified: Secondary | ICD-10-CM | POA: Insufficient documentation

## 2019-11-19 DIAGNOSIS — Z23 Encounter for immunization: Secondary | ICD-10-CM | POA: Insufficient documentation

## 2019-11-19 DIAGNOSIS — S61511A Laceration without foreign body of right wrist, initial encounter: Secondary | ICD-10-CM

## 2019-11-19 DIAGNOSIS — W25XXXA Contact with sharp glass, initial encounter: Secondary | ICD-10-CM | POA: Insufficient documentation

## 2019-11-19 DIAGNOSIS — F17210 Nicotine dependence, cigarettes, uncomplicated: Secondary | ICD-10-CM | POA: Insufficient documentation

## 2019-11-19 DIAGNOSIS — Y929 Unspecified place or not applicable: Secondary | ICD-10-CM | POA: Insufficient documentation

## 2019-11-19 MED ORDER — OXYCODONE-ACETAMINOPHEN 5-325 MG PO TABS
1.0000 | ORAL_TABLET | Freq: Once | ORAL | Status: AC
  Administered 2019-11-19: 1 via ORAL
  Filled 2019-11-19: qty 1

## 2019-11-19 MED ORDER — FENTANYL CITRATE (PF) 100 MCG/2ML IJ SOLN
50.0000 ug | Freq: Once | INTRAMUSCULAR | Status: AC
  Administered 2019-11-19: 09:00:00 50 ug via INTRAVENOUS
  Filled 2019-11-19: qty 2

## 2019-11-19 MED ORDER — TETANUS-DIPHTH-ACELL PERTUSSIS 5-2.5-18.5 LF-MCG/0.5 IM SUSP
0.5000 mL | Freq: Once | INTRAMUSCULAR | Status: AC
  Administered 2019-11-19: 0.5 mL via INTRAMUSCULAR
  Filled 2019-11-19: qty 0.5

## 2019-11-19 MED ORDER — DOXYCYCLINE HYCLATE 100 MG PO CAPS: 100.0000 mg | ORAL_CAPSULE | Freq: Two times a day (BID) | ORAL | 0 refills | Status: AC

## 2019-11-19 MED ORDER — BACITRACIN ZINC 500 UNIT/GM EX OINT
TOPICAL_OINTMENT | Freq: Once | CUTANEOUS | Status: AC
  Administered 2019-11-19: 1 via TOPICAL

## 2019-11-19 MED ORDER — LIDOCAINE-EPINEPHRINE 2 %-1:200000 IJ SOLN
20.0000 mL | Freq: Once | INTRAMUSCULAR | Status: AC
Start: 2019-11-19 — End: 2019-11-19
  Administered 2019-11-19: 20 mL via INTRADERMAL
  Filled 2019-11-19: qty 20

## 2019-11-19 MED ORDER — ONDANSETRON HCL 4 MG/2ML IJ SOLN
4.0000 mg | Freq: Once | INTRAMUSCULAR | Status: AC
  Administered 2019-11-19: 4 mg via INTRAVENOUS
  Filled 2019-11-19: qty 2

## 2019-11-19 MED ORDER — CEFAZOLIN SODIUM-DEXTROSE 2-4 GM/100ML-% IV SOLN
2.0000 g | Freq: Once | INTRAVENOUS | Status: AC
  Administered 2019-11-19: 08:00:00 2 g via INTRAVENOUS
  Filled 2019-11-19: qty 100

## 2019-11-19 NOTE — ED Notes (Signed)
Pt. Transported to Xray 

## 2019-11-19 NOTE — ED Notes (Signed)
Bacitracin ointment applied to right hand; wound dressing applied.

## 2019-11-19 NOTE — ED Notes (Signed)
Called for room with no response, unable to locate patient in ED lobby.

## 2019-11-19 NOTE — ED Triage Notes (Signed)
Wound bandaged in triage

## 2019-11-19 NOTE — ED Provider Notes (Signed)
[facility] EMERGENCY DEPARTMENT Provider Note   CSN: [id number] Arrival date & time: 11/19/19  0426     History Chief Complaint  Patient presents with  . Laceration    Derek Austin is a 49 y.o. male past medical history of hypertension, subarachnoid bleed who presents for evaluation of laceration noted to the ulnar aspect of the right wrist that occurred at approximately 10 PM last evening.  Patient reports that he was drinking alcohol last evening and states that he fell against a glass table that was broken.  He reports that a shard of glass hit him on his right wrist which caused a laceration.  He does not know when his last tetanus shot was.  He is on any blood thinners.  He reports pain with movement of the wrist and fingers.  He denies any numbness/weakness.  The history is provided by the patient.       Past Medical History:  Diagnosis Date  . Hypertension   . Subarachnoid bleed (HCC)   . Subdural hematoma (HCC)     There are no problems to display for this patient.   Past Surgical History:  Procedure Laterality Date  . ANKLE SURGERY    . HERNIA REPAIR         No family history on file.  Social History   Tobacco Use  . Smoking status: Current Every Day Smoker    Packs/day: 1.00  . Smokeless tobacco: Never Used  Substance Use Topics  . Alcohol use: Yes    Comment: 6 pack of beer per day  . Drug use: Yes    Types: Marijuana    Home Medications Prior to Admission medications   Medication Sig Start Date End Date Taking? Authorizing Provider  carbamide peroxide (DEBROX) 6.5 % otic solution Place 5 drops into the right ear 2 (two) times daily as needed. Patient not taking: Reported on 05/22/2017 05/21/14   Piepenbrink, Victorino Dike, PA-C  cyclobenzaprine (FLEXERIL) 5 MG tablet Take 1 tablet (5 mg total) by mouth 3 (three) times daily as needed for muscle spasms. Patient not taking: Reported on 05/22/2017 12/06/14   Hayden Rasmussen, NP    diclofenac (CATAFLAM) 50 MG tablet Take 1 tablet (50 mg total) by mouth 3 (three) times daily. One tablet TID with food prn pain. Patient not taking: Reported on 05/22/2017 12/06/14   Hayden Rasmussen, NP  doxycycline (VIBRAMYCIN) 100 MG capsule Take 1 capsule (100 mg total) by mouth 2 (two) times daily for 7 days. 11/19/19 11/26/19  Maxwell Caul, PA-C  famotidine (PEPCID) 20 MG tablet Take 1 tablet (20 mg total) by mouth 2 (two) times daily. Patient not taking: Reported on 05/22/2017 01/17/16   Pisciotta, Joni Reining, PA-C  ibuprofen (ADVIL,MOTRIN) 800 MG tablet Take 1 tablet (800 mg total) by mouth 3 (three) times daily. Patient not taking: Reported on 05/22/2017 05/21/14   Piepenbrink, Victorino Dike, PA-C  levofloxacin (LEVAQUIN) 750 MG tablet Take 1 tablet (750 mg total) by mouth daily. 05/22/17   Gilda Crease, MD  naproxen (NAPROSYN) 500 MG tablet Take 1 tablet (500 mg total) by mouth 2 (two) times daily. Patient not taking: Reported on 05/22/2017 03/09/16   Sam, Ace Gins, PA-C  Phenyleph-Doxylamine-DM-APAP (NYQUIL SEVERE COLD/FLU) 5-6.25-10-325 MG/15ML LIQD Take 15 mLs by mouth as needed (for cold and flu).    [provider]  predniSONE (DELTASONE) 50 MG tablet Take 1 tablet daily with breakfast Patient not taking: Reported on 05/22/2017 01/17/16   Pisciotta, Joni Reining, PA-C  sodium chloride (OCEAN) 0.65 % SOLN nasal spray Place 1 spray into both nostrils as needed for congestion. Patient not taking: Reported on 05/22/2017 05/21/14   Piepenbrink, Victorino Dike, PA-C  traMADol (ULTRAM) 50 MG tablet 1-2 tabs po q 6 hr prn pain Maximum dose= 8 tablets per day Patient not taking: Reported on 05/22/2017 12/06/14   Hayden Rasmussen, NP    Allergies    Patient has no known allergies.  Review of Systems   Review of Systems  Skin: Positive for wound.  Neurological: Negative for weakness and numbness.  All other systems reviewed and are negative.   Physical Exam Updated Vital Signs BP 123/88 (BP  Location: Left Arm)   Pulse 63   Temp 98.4 F (36.9 C) (Oral)   Resp 17   Ht 5\' 9"  (1.753 m)   Wt 59 kg   SpO2 95%   BMI 19.20 kg/m   Physical Exam Vitals and nursing note reviewed.  Constitutional:      Appearance: He is well-developed.  HENT:     Head: Normocephalic and atraumatic.  Eyes:     General: No scleral icterus.       Right eye: No discharge.        Left eye: No discharge.     Conjunctiva/sclera: Conjunctivae normal.  Cardiovascular:     Pulses:          Radial pulses are 2+ on the right side and 2+ on the left side.  Pulmonary:     Effort: Pulmonary effort is normal.  Musculoskeletal:     Comments: He is holding wrist in slightly flexed position.  He can flex but has difficulty with extending secondary to pain.  Additionally, his fourth and fifth digits are held in slightly flexed position.  He can make a fist but does report pain when doing so.  With extension, he has almost full extension of digits 1, 2, 3 but some limited extension of digits 4, 5.  Skin:    General: Skin is warm and dry.     Capillary Refill: Capillary refill takes less than 2 seconds.     Comments: Good distal cap refill.  RUE is not dusky in appearance or cool to touch.  4 cm curvilinear, jagged laceration noted to the ulnar aspect of the wrist that begins on the volar aspect just lateral to midline and wraps around to the lateral dorsal aspect of the wrist.  Neurological:     Mental Status: He is alert.  Psychiatric:        Speech: Speech normal.        Behavior: Behavior normal.       ED Results / Procedures / Treatments   Labs (all labs ordered are listed, but only abnormal results are displayed) Labs Reviewed - No data to display  EKG None  Radiology DG Wrist Complete Right  Result Date: 11/19/2019 CLINICAL DATA:  Wrist laceration by glass. EXAM: RIGHT WRIST - COMPLETE 3+ VIEW COMPARISON:  None. FINDINGS: Medial-sided soft tissue irregularity consistent with history of  laceration. No opaque foreign body or acute fracture. Remote third and fourth metacarpal shaft fractures that have healed. First MCP osteoarthritis. IMPRESSION: No opaque foreign body associated with the wrist laceration. Electronically Signed   By: 01/19/2020 M.D.   On: 11/19/2019 07:39    Procedures .06/08/2021Laceration Repair  Date/Time: 11/19/2019 9:36 AM Performed by: 01/19/2020, PA-C Authorized by: Maxwell Caul, PA-C   Consent:    Consent obtained:  Verbal  Consent given by:  Patient   Risks discussed:  Infection, need for additional repair, pain, poor cosmetic result and poor wound healing   Alternatives discussed:  No treatment and delayed treatment Universal protocol:    Procedure explained and questions answered to patient or proxy's satisfaction: yes     Relevant documents present and verified: yes     Test results available and properly labeled: yes     Imaging studies available: yes     Required blood products, implants, devices, and special equipment available: yes     Site/side marked: yes     Immediately prior to procedure, a time out was called: yes     Patient identity confirmed:  Verbally with patient Anesthesia (see MAR for exact dosages):    Anesthesia method:  Local infiltration   Local anesthetic:  Lidocaine 2% WITH epi Laceration details:    Location:  Hand   Hand location:  R wrist   Length (cm):  4 Pre-procedure details:    Preparation:  Patient was prepped and draped in usual sterile fashion Exploration:    Hemostasis achieved with:  Direct pressure   Wound exploration: wound explored through full range of motion     Wound extent: no foreign bodies/material noted and no tendon damage noted   Treatment:    Area cleansed with:  Betadine   Amount of cleaning:  Extensive   Irrigation solution:  Sterile saline   Irrigation method:  Syringe   Visualized foreign bodies/material removed: no   Mucous membrane repair:    Suture size:  5-0   Suture  material:  Vicryl   Suture technique:  Simple interrupted   Number of sutures:  3 Skin repair:    Repair method:  Sutures   Suture size:  4-0   Suture material:  Nylon   Suture technique:  Simple interrupted   Number of sutures:  12 Approximation:    Approximation:  Close Post-procedure details:    Dressing:  Antibiotic ointment   Patient tolerance of procedure:  Tolerated well, no immediate complications Comments:     Once the wound was anesthetized and extensively irrigated with a liter of saline, exploration of the wound showed no evidence of foreign body.  There was some visible tendon noted but appeared to be without any injury.  He was able to have better range of motion once the anesthesia was in the wound and was able to fully flex and extend with any difficulty.  During this range of motion, I saw no evidence of tendon disruption.  Subcuticular sutures were used to bring the wound closer together and then skin repair was used with nylon.   (including critical care time)  Medications Ordered in ED Medications  Tdap (BOOSTRIX) injection 0.5 mL (0.5 mLs Intramuscular Given 11/19/19 0659)  oxyCODONE-acetaminophen (PERCOCET/ROXICET) 5-325 MG per tablet 1 tablet (1 tablet Oral Given 11/19/19 0700)  ceFAZolin (ANCEF) IVPB 2g/100 mL premix (0 g Intravenous Stopped 11/19/19 0825)  lidocaine-EPINEPHrine (XYLOCAINE W/EPI) 2 %-1:200000 (PF) injection 20 mL (20 mLs Intradermal Given 11/19/19 0754)  fentaNYL (SUBLIMAZE) injection 50 mcg (50 mcg Intravenous Given 11/19/19 0832)  ondansetron (ZOFRAN) injection 4 mg (4 mg Intravenous Given 11/19/19 0835)  bacitracin ointment (1 application Topical Given 11/19/19 1003)    ED Course  I have reviewed the triage vital signs and the nursing notes.  Pertinent labs & imaging results that were available during my care of the patient were reviewed by me and considered in my medical decision making (  see chart for details).    MDM Rules/Calculators/A&P                       49 year old male who presents for evaluation of laceration that occurred at 10 PM last evening.  He reports that he was drinking some alcohol and fell against a glass table which broke and caused a laceration to his wrist.  No blood thinners.  Unsure of last tetanus.  Initially arrival, he is afebrile, nontoxic-appearing.  Vital signs are stable.  He is neurovascularly intact.  He has a large wound on the ulnar aspect of wrist.  Plan for wound care, tetanus update.  Patient with no known drug allergies.  Given prolonged open wound time, will plan to start him on Ancef.  X-ray negative for any acute abnormalities.  I discussed with Hilbert Odor, PA-C (Ortho) after evaluation in the ED. recommends repair here in the ED.  No indication for hand follow-up.  Laceration repaired as documented above.  Patient tolerated procedure well.  Given that it is close to 12 hours since the onset of the initial injury, will plan to start him on antibiotics prevent infection.  Patient no known drug allergies.  Encouraged at home supportive care measures. At this time, patient exhibits no emergent life-threatening condition that require further evaluation in ED or admission. Patient had ample opportunity for questions and discussion. All patient's questions were answered with full understanding. Strict return precautions discussed. Patient expresses understanding and agreement to plan.   Portions of this note were generated with Lobbyist. Dictation errors may occur despite best attempts at proofreading.  Final Clinical Impression(s) / ED Diagnoses Final diagnoses:  Laceration of right wrist, initial encounter    Rx / DC Orders ED Discharge Orders         Ordered    doxycycline (VIBRAMYCIN) 100 MG capsule  2 times daily     11/19/19 0939           Volanda Napoleon, PA-C 11/19/19 1015    Malvin Johns, MD 11/19/19 1022

## 2019-11-19 NOTE — ED Notes (Signed)
Pt. Returned from Xray 

## 2019-11-19 NOTE — Discharge Instructions (Signed)
Keep the wound clean and dry for the first 24 hours. After that you may gently clean the wound with soap and water. Make sure to pat dry the wound before covering it with any dressing. You can use topical antibiotic ointment and bandage. Ice and elevate for pain relief.   You can take Tylenol or Ibuprofen as directed for pain. You can alternate Tylenol and Ibuprofen every 4 hours for additional pain relief.   Take antibiotics as directed. Please take all of your antibiotics until finished.  Return to the Emergency Department, your primary care doctor, or the [facility] in 7-10 days for suture removal.   Monitor closely for any signs of infection. Return to the Emergency Department for any worsening redness/swelling of the area that begins to spread, drainage from the site, worsening pain, fever or any other worsening or concerning symptoms.    

## 2019-11-19 NOTE — ED Triage Notes (Signed)
The pt fell against a glass table top at home just a few minutes ago 3" laceration rt wrist deep tendons exposed  The pt has been drinking alcohol

## 2019-11-19 NOTE — Consult Note (Signed)
Reason for Consult:Right wrist laceration Referring Physician: Mauri Tolen is an 49 y.o. male.  HPI: Derek Austin comes in after lacerating his right wrist on a broken glass table. He c/o pain localized to the area. Hand surgery was consulted to make sure he did not need any specialized treatment. 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  Past Medical History:  Diagnosis Date  . Hypertension   . Subarachnoid bleed (HCC)   . Subdural hematoma [facility])     Past Surgical History:  Procedure Laterality Date  . ANKLE SURGERY    . HERNIA REPAIR      No family history on file.  Social History:  reports that he has been smoking. He has been smoking about 1.00 pack per day. He has never used smokeless tobacco. He reports current alcohol use. He reports current drug use. Drug: Marijuana.  Allergies: No Known Allergies  Medications: I have reviewed the patient's current medications.  No results found for this or any previous visit (from the past 48 hour(s)).  DG Wrist Complete Right  Result Date: 11/19/2019 CLINICAL DATA:  Wrist laceration by glass. EXAM: RIGHT WRIST - COMPLETE 3+ VIEW COMPARISON:  None. FINDINGS: Medial-sided soft tissue irregularity consistent with history of laceration. No opaque foreign body or acute fracture. Remote third and fourth metacarpal shaft fractures that have healed. First MCP osteoarthritis. IMPRESSION: No opaque foreign body associated with the wrist laceration. Electronically Signed   By: Marnee Spring M.D.   On: 11/19/2019 07:39    Review of Systems  HENT: Negative for ear discharge, ear pain, hearing loss and tinnitus.   Eyes: Negative for photophobia and pain.  Respiratory: Negative for cough and shortness of breath.   Cardiovascular: Negative for chest pain.  Gastrointestinal: Negative for abdominal pain, nausea and vomiting.  Genitourinary: Negative for dysuria, flank pain, frequency and urgency.  Musculoskeletal: Positive for  myalgias (Right wrist). Negative for back pain and neck pain.  Neurological: Negative for dizziness and headaches.  Hematological: Does not bruise/bleed easily.  Psychiatric/Behavioral: The patient is not nervous/anxious.    Blood pressure (!) 125/93, pulse 80, temperature 98.4 F (36.9 C), temperature source Oral, resp. rate 18, height 5\' 9"  (1.753 m), weight 59 kg, SpO2 99 %. Physical Exam  Constitutional: He appears well-developed and well-nourished. No distress.  HENT:  Head: Normocephalic and atraumatic.  Eyes: Conjunctivae are normal. Right eye exhibits no discharge. Left eye exhibits no discharge. No scleral icterus.  Cardiovascular: Normal rate and regular rhythm.  Respiratory: Effort normal. No respiratory distress.  Musculoskeletal:     Cervical back: Normal range of motion.     Comments: Right shoulder, elbow, wrist, digits- Laceration to ulnar wrist, mod TTP, no instability, no blocks to motion  Sens  Ax/R/M/U intact  Mot   Ax/ R/ PIN/ M/ AIN/ U intact  Rad 2+  Neurological: He is alert.  Skin: Skin is warm and dry. He is not diaphoretic.  Psychiatric: He has a normal mood and affect. His behavior is normal.      Assessment/Plan: Right wrist lac -- I think this can be safely closed by the EDPA. He does not need formal hand surgery follow-up.    , PA-C Orthopedic Surgery [phone] 11/19/2019, 9:23 AM

## 2019-11-19 NOTE — ED Notes (Signed)
Right hand sutured, 15 well approximated noted.

## 2019-12-02 ENCOUNTER — Emergency Department (HOSPITAL_COMMUNITY)
Admission: EM | Admit: 2019-12-02 | Discharge: 2019-12-02 | Disposition: A | Discharge status: Home / Self Care | Payer: Medicaid Other | Attending: Emergency Medicine | Admitting: Emergency Medicine

## 2019-12-02 ENCOUNTER — Encounter (HOSPITAL_COMMUNITY): Payer: Self-pay | Admitting: Emergency Medicine

## 2019-12-02 DIAGNOSIS — F17210 Nicotine dependence, cigarettes, uncomplicated: Secondary | ICD-10-CM | POA: Insufficient documentation

## 2019-12-02 DIAGNOSIS — Z4802 Encounter for removal of sutures: Secondary | ICD-10-CM

## 2019-12-02 NOTE — ED Notes (Signed)
Patient verbalizes understanding of discharge instructions. Opportunity for questioning and answers were provided. Armband removed by staff. Patient discharged from ED.  

## 2019-12-02 NOTE — ED Triage Notes (Signed)
Pt here to have stitches removed , pt had them placed on the  8th

## 2019-12-02 NOTE — Discharge Instructions (Signed)
I have removed 15 stitches from your right hand, please continue to apply Neosporin to the area.  If you are returning back to work, please make sure this wound is wrapped while handling food.

## 2019-12-02 NOTE — ED Provider Notes (Signed)
[place] EMERGENCY DEPARTMENT Provider Note   CSN: [id number] Arrival date & time: 12/02/19  1420     History No chief complaint on file.   Derek Austin is a 49 y.o. male.  49 y.o male with a  PMH of HTN, Subdural hematoma presents to the ED with request for suture removal.  Patient had 15 stitches placed to his right hand approximately 2 weeks ago.  He reports he been applying Neosporin to the area, keeping this clean.  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  No fevers, drainage.  He does report completing a full course of antibiotics.  The history is provided by the patient.       Past Medical History:  Diagnosis Date  . Hypertension   . Subarachnoid bleed ([place])   . Subdural hematoma (HCC)     There are no problems to display for this patient.   Past Surgical History:  Procedure Laterality Date  . ANKLE SURGERY    . HERNIA REPAIR         History reviewed. No pertinent family history.  Social History   Tobacco Use  . Smoking status: Current Every Day Smoker    Packs/day: 1.00  . Smokeless tobacco: Never Used  Substance Use Topics  . Alcohol use: Yes    Comment: 6 pack of beer per day  . Drug use: Yes    Types: Marijuana    Home Medications Prior to Admission medications   Medication Sig Start Date End Date Taking? Authorizing Provider  carbamide peroxide (DEBROX) 6.5 % otic solution Place 5 drops into the right ear 2 (two) times daily as needed. Patient not taking: Reported on 05/22/2017 05/21/14   Piepenbrink, Anderson Malta, PA-C  cyclobenzaprine (FLEXERIL) 5 MG tablet Take 1 tablet (5 mg total) by mouth 3 (three) times daily as needed for muscle spasms. Patient not taking: Reported on 05/22/2017 12/06/14   Janne Napoleon, NP  diclofenac (CATAFLAM) 50 MG tablet Take 1 tablet (50 mg total) by mouth 3 (three) times daily. One tablet TID with food prn pain. Patient not taking: Reported on  05/22/2017 12/06/14   Janne Napoleon, NP  famotidine (PEPCID) 20 MG tablet Take 1 tablet (20 mg total) by mouth 2 (two) times daily. Patient not taking: Reported on 05/22/2017 01/17/16   Pisciotta, Elmyra Ricks, PA-C  ibuprofen (ADVIL,MOTRIN) 800 MG tablet Take 1 tablet (800 mg total) by mouth 3 (three) times daily. Patient not taking: Reported on 05/22/2017 05/21/14   Piepenbrink, Anderson Malta, PA-C  levofloxacin (LEVAQUIN) 750 MG tablet Take 1 tablet (750 mg total) by mouth daily. 05/22/17   Orpah Greek, MD  naproxen (NAPROSYN) 500 MG tablet Take 1 tablet (500 mg total) by mouth 2 (two) times daily. Patient not taking: Reported on 05/22/2017 03/09/16   Sam, Olivia Canter, PA-C  Phenyleph-Doxylamine-DM-APAP (NYQUIL SEVERE COLD/FLU) 5-6.25-10-325 MG/15ML LIQD Take 15 mLs by mouth as needed (for cold and flu).    [provider]  predniSONE (DELTASONE) 50 MG tablet Take 1 tablet daily with breakfast Patient not taking: Reported on 05/22/2017 01/17/16   Pisciotta, Elmyra Ricks, PA-C  sodium chloride (OCEAN) 0.65 % SOLN nasal spray Place 1 spray into both nostrils as needed for congestion. Patient not taking: Reported on 05/22/2017 05/21/14   Piepenbrink, Anderson Malta, PA-C  traMADol (ULTRAM) 50 MG tablet 1-2 tabs po q 6 hr prn pain Maximum dose= 8 tablets per day Patient not taking: Reported on 05/22/2017 12/06/14   Mabe,  Onalee Hua, NP    Allergies    Patient has no known allergies.  Review of Systems   Review of Systems  Constitutional: Negative for fever.    Physical Exam Updated Vital Signs BP 100/82 (BP Location: Right Arm)   Pulse 97   Resp 16   Ht 5\' 9"  (1.753 m)   Wt 69.4 kg   SpO2 99%   BMI 22.59 kg/m   Physical Exam Vitals and nursing note reviewed.  Constitutional:      Appearance: Normal appearance.  HENT:     Head: Normocephalic and atraumatic.  Cardiovascular:     Rate and Rhythm: Normal rate.  Pulmonary:     Effort: Pulmonary effort is normal.  Musculoskeletal:     Cervical  back: Normal range of motion and neck supple.  Skin:    General: Skin is warm and dry.     Findings: No erythema.     Comments: 15 sutures placed to the right hand, well appearing wound, no drainage noted.  Neurological:     Mental Status: He is alert and oriented to person, place, and time.     ED Results / Procedures / Treatments   Labs (all labs ordered are listed, but only abnormal results are displayed) Labs Reviewed - No data to display  EKG None  Radiology No results found.  Procedures Procedures (including critical care time)  Medications Ordered in ED Medications - No data to display  ED Course  I have reviewed the triage vital signs and the nursing notes.  Pertinent labs & imaging results that were available during my care of the patient were reviewed by me and considered in my medical decision making (see chart for details).    MDM Rules/Calculators/A&P     Patient here for suture removal, wound is well-appearing, appears to be healed, I have removed 15 stitches from his right hand, vitals are within normal limits.  Wound is very well-appearing.  Return precautions discussed at length.   Portions of this note were generated with . Dictation errors may occur despite best attempts at proofreading.  Final Clinical Impression(s) / ED Diagnoses Final diagnoses:  Visit for suture removal    Rx / DC Orders ED Discharge Orders    None       Scientist, clinical (histocompatibility and immunogenetics), PA-C 12/02/19 1505    [place], 12/04/19, MD 12/02/19 [phone]

## 2020-07-15 ENCOUNTER — Encounter (HOSPITAL_COMMUNITY): Payer: Self-pay

## 2020-07-15 ENCOUNTER — Emergency Department (HOSPITAL_COMMUNITY)
Admission: EM | Admit: 2020-07-15 | Discharge: 2020-07-15 | Disposition: A | Discharge status: Home / Self Care | Payer: Medicaid Other | Attending: Emergency Medicine | Admitting: Emergency Medicine

## 2020-07-15 ENCOUNTER — Other Ambulatory Visit: Payer: Self-pay

## 2020-07-15 DIAGNOSIS — K625 Hemorrhage of anus and rectum: Secondary | ICD-10-CM | POA: Insufficient documentation

## 2020-07-15 DIAGNOSIS — Z5321 Procedure and treatment not carried out due to patient leaving prior to being seen by health care provider: Secondary | ICD-10-CM | POA: Insufficient documentation

## 2020-07-15 LAB — CBC
HCT: 39.6 % (ref 39.0–52.0)
Hemoglobin: 13 g/dL (ref 13.0–17.0)
MCH: 30.4 pg (ref 26.0–34.0)
MCHC: 32.8 g/dL (ref 30.0–36.0)
MCV: 92.5 fL (ref 80.0–100.0)
Platelets: 302 10*3/uL (ref 150–400)
RBC: 4.28 MIL/uL (ref 4.22–5.81)
RDW: 15.4 % (ref 11.5–15.5)
WBC: 10 10*3/uL (ref 4.0–10.5)
nRBC: 0 % (ref 0.0–0.2)

## 2020-07-15 LAB — COMPREHENSIVE METABOLIC PANEL
ALT: 22 U/L (ref 0–44)
AST: 38 U/L (ref 15–41)
Albumin: 4 g/dL (ref 3.5–5.0)
Alkaline Phosphatase: 74 U/L (ref 38–126)
Anion gap: 12 (ref 5–15)
BUN: 10 mg/dL (ref 6–20)
CO2: 22 mmol/L (ref 22–32)
Calcium: 8.8 mg/dL — ABNORMAL LOW (ref 8.9–10.3)
Chloride: 102 mmol/L (ref 98–111)
Creatinine, Ser: 0.95 mg/dL (ref 0.61–1.24)
GFR, Estimated: >60 mL/min (ref >60)
Glucose, Bld: 120 mg/dL — ABNORMAL HIGH (ref 70–99)
Potassium: 4 mmol/L (ref 3.5–5.1)
Sodium: 136 mmol/L (ref 135–145)
Total Bilirubin: 0.4 mg/dL (ref 0.3–1.2)
Total Protein: 7.6 g/dL (ref 6.5–8.1)

## 2020-07-15 LAB — TYPE AND SCREEN
ABO/RH(D): O POS
Antibody Screen: NEGATIVE

## 2020-07-15 NOTE — ED Triage Notes (Signed)
Pt reports a few months of intermittent rectal bleeding. Denies abd pain or rectal pain. Admits to drinking alcohol today. Pt a.o, nad noted

## 2020-07-15 NOTE — ED Notes (Signed)
Pt name called for updated vitals, no response. Pt not in restroom or outside.

## 2020-07-15 NOTE — ED Notes (Signed)
Pt name called for updated vitals, no response 

## 2021-01-25 ENCOUNTER — Encounter (HOSPITAL_COMMUNITY): Payer: Self-pay

## 2021-01-25 ENCOUNTER — Other Ambulatory Visit: Payer: Self-pay

## 2021-01-25 ENCOUNTER — Ambulatory Visit (HOSPITAL_COMMUNITY)
Admission: EM | Admit: 2021-01-25 | Discharge: 2021-01-25 | Disposition: A | Discharge status: Other Acute Inpatient Hospital | Payer: Self-pay | Attending: Urgent Care | Admitting: Urgent Care

## 2021-01-25 ENCOUNTER — Emergency Department (HOSPITAL_COMMUNITY)
Admission: EM | Admit: 2021-01-25 | Discharge: 2021-01-25 | Disposition: A | Discharge status: Home / Self Care | Payer: Self-pay | Attending: Emergency Medicine | Admitting: Emergency Medicine

## 2021-01-25 ENCOUNTER — Encounter (HOSPITAL_COMMUNITY): Payer: Self-pay | Admitting: Pharmacy Technician

## 2021-01-25 ENCOUNTER — Emergency Department (HOSPITAL_COMMUNITY): Payer: Self-pay

## 2021-01-25 DIAGNOSIS — I10 Essential (primary) hypertension: Secondary | ICD-10-CM | POA: Insufficient documentation

## 2021-01-25 DIAGNOSIS — F17210 Nicotine dependence, cigarettes, uncomplicated: Secondary | ICD-10-CM | POA: Insufficient documentation

## 2021-01-25 DIAGNOSIS — K047 Periapical abscess without sinus: Secondary | ICD-10-CM | POA: Insufficient documentation

## 2021-01-25 LAB — CBC WITH DIFFERENTIAL/PLATELET
Abs Immature Granulocytes: 0.13 10*3/uL — ABNORMAL HIGH (ref 0.00–0.07)
Basophils Absolute: 0 10*3/uL (ref 0.0–0.1)
Basophils Relative: 0 %
Eosinophils Absolute: 0.1 10*3/uL (ref 0.0–0.5)
Eosinophils Relative: 1 %
HCT: 42 % (ref 39.0–52.0)
Hemoglobin: 13.8 g/dL (ref 13.0–17.0)
Immature Granulocytes: 1 %
Lymphocytes Relative: 5 %
Lymphs Abs: 0.9 10*3/uL (ref 0.7–4.0)
MCH: 30.1 pg (ref 26.0–34.0)
MCHC: 32.9 g/dL (ref 30.0–36.0)
MCV: 91.7 fL (ref 80.0–100.0)
Monocytes Absolute: 0.9 10*3/uL (ref 0.1–1.0)
Monocytes Relative: 5 %
Neutro Abs: 15.5 10*3/uL — ABNORMAL HIGH (ref 1.7–7.7)
Neutrophils Relative %: 88 %
Platelets: 241 10*3/uL (ref 150–400)
RBC: 4.58 MIL/uL (ref 4.22–5.81)
RDW: 15.4 % (ref 11.5–15.5)
WBC: 17.6 10*3/uL — ABNORMAL HIGH (ref 4.0–10.5)
nRBC: 0 % (ref 0.0–0.2)

## 2021-01-25 LAB — BASIC METABOLIC PANEL
Anion gap: 10 (ref 5–15)
BUN: 10 mg/dL (ref 6–20)
CO2: 24 mmol/L (ref 22–32)
Calcium: 9.6 mg/dL (ref 8.9–10.3)
Chloride: 101 mmol/L (ref 98–111)
Creatinine, Ser: 0.93 mg/dL (ref 0.61–1.24)
GFR, Estimated: >60 mL/min (ref >60)
Glucose, Bld: 114 mg/dL — ABNORMAL HIGH (ref 70–99)
Potassium: 3.9 mmol/L (ref 3.5–5.1)
Sodium: 135 mmol/L (ref 135–145)

## 2021-01-25 MED ORDER — AMOXICILLIN 500 MG PO CAPS: 500.0000 mg | ORAL_CAPSULE | Freq: Three times a day (TID) | ORAL | 0 refills | Status: DC

## 2021-01-25 MED ORDER — AMOXICILLIN-POT CLAVULANATE 875-125 MG PO TABS
1.0000 | ORAL_TABLET | Freq: Once | ORAL | Status: AC
  Administered 2021-01-25: 1 via ORAL
  Filled 2021-01-25: qty 1

## 2021-01-25 MED ORDER — IOHEXOL 350 MG/ML SOLN
50.0000 mL | Freq: Once | INTRAVENOUS | Status: AC | PRN
  Administered 2021-01-25: 50 mL via INTRAVENOUS

## 2021-01-25 MED ORDER — HYDROCODONE-ACETAMINOPHEN 5-325 MG PO TABS
ORAL_TABLET | ORAL | Status: AC
  Filled 2021-01-25: qty 1

## 2021-01-25 MED ORDER — HYDROCODONE-ACETAMINOPHEN 5-325 MG PO TABS
1.0000 | ORAL_TABLET | Freq: Once | ORAL | Status: AC
  Administered 2021-01-25: 1 via ORAL

## 2021-01-25 MED ORDER — HYDROCODONE-ACETAMINOPHEN 5-325 MG PO TABS: 1.0000 | ORAL_TABLET | Freq: Four times a day (QID) | ORAL | 0 refills | Status: DC | PRN

## 2021-01-25 MED ORDER — FENTANYL CITRATE (PF) 100 MCG/2ML IJ SOLN
50.0000 ug | Freq: Once | INTRAMUSCULAR | Status: AC
  Administered 2021-01-25: 50 ug via INTRAVENOUS
  Filled 2021-01-25: qty 2

## 2021-01-25 NOTE — ED Provider Notes (Signed)
Emergency Medicine Provider Triage Evaluation Note  Derek Austin , a 50 y.o. male  was evaluated in triage.  Pt complains of left-sided facial swelling with dental pain, sent by urgent care with concern for abscess.  Reports dental pain onset Monday..  Review of Systems  Positive: Facial swelling, dental pain, pain opening mouth Negative: Difficulty breathing or swallowing, chest pain  Physical Exam  BP (!) 148/109 (BP Location: Right Arm)   Pulse 86   Temp 97.6 F (36.4 C) (Oral)   Resp 18   SpO2 99%  Gen:   Awake, no distress   Resp:  Normal effort  MSK:   Moves extremities without difficulty  Other:  Left side facial swelling with maxillary tenderness, mikld trismus  Medical Decision Making  Medically screening exam initiated at 10:56 AM.  Appropriate orders placed.  Derek Austin was informed that the remainder of the evaluation will be completed by another provider, this initial triage assessment does not replace that evaluation, and the importance of remaining in the ED until their evaluation is complete.     Derek Fend, PA-C 01/25/21 1058    Derek Munch, MD 01/25/21 1427

## 2021-01-25 NOTE — ED Triage Notes (Signed)
Pt here from UC with L sided dental pain/swelling onset Monday.

## 2021-01-25 NOTE — ED Notes (Signed)
Patient is being discharged from the Urgent Care and sent to the Emergency Department via POV. Per Wallis Bamberg PA-C, patient is in need of higher level of care due to severity of a dental abcess. Patient is aware and verbalizes understanding of plan of care.  Vitals:   01/25/21 0933 01/25/21 0936  BP: (!) 164/112 (!) 180/118  Pulse: 98   Resp: 19   Temp: 98.3 F (36.8 C)   SpO2: 100%

## 2021-01-25 NOTE — ED Notes (Signed)
Pt stepped outside.  

## 2021-01-25 NOTE — ED Provider Notes (Signed)
[facility] EMERGENCY DEPARTMENT Provider Note   CSN: [id number] Arrival date & time: 01/25/21  1011     History Chief Complaint  Patient presents with   Dental Pain    Derek [facility] is a 50 y.o. male.  HPI Patient presents concern of left facial pain, swelling.  Pain is severe, sore, persistent, not improved with ibuprofen. No fever, vomiting, diarrhea, chest pain, dyspnea. Patient to urgent care, and was sent here for evaluation. He notes that he has some dental issues, but has no dentist. He takes no blood thinning medication. Since onset, with increasing pain, swelling, new erythema he became concerned and now presents for evaluation.    Past Medical History:  Diagnosis Date   Hypertension    Subarachnoid bleed (HCC)    Subdural hematoma (HCC)     There are no problems to display for this patient.   Past Surgical History:  Procedure Laterality Date   ANKLE SURGERY     HERNIA REPAIR         History reviewed. No pertinent family history.  Social History   Tobacco Use   Smoking status: Every Day    Packs/day: 1.00    Pack years: 0.00    Types: Cigarettes   Smokeless tobacco: Never  Substance Use Topics   Alcohol use: Yes    Comment: 6 pack of beer per day   Drug use: Yes    Types: Marijuana    Home Medications Prior to Admission medications   Medication Sig Start Date End Date Taking? Authorizing Provider  carbamide peroxide (DEBROX) 6.5 % otic solution Place 5 drops into the right ear 2 (two) times daily as needed. Patient not taking: Reported on 05/22/2017 05/21/14   Piepenbrink, Victorino Dike, PA-C  cyclobenzaprine (FLEXERIL) 5 MG tablet Take 1 tablet (5 mg total) by mouth 3 (three) times daily as needed for muscle spasms. Patient not taking: Reported on 05/22/2017 12/06/14   Hayden Rasmussen, NP  diclofenac (CATAFLAM) 50 MG tablet Take 1 tablet (50 mg total) by mouth 3 (three) times daily. One tablet TID with food prn pain. Patient not  taking: Reported on 05/22/2017 12/06/14   Hayden Rasmussen, NP  famotidine (PEPCID) 20 MG tablet Take 1 tablet (20 mg total) by mouth 2 (two) times daily. Patient not taking: Reported on 05/22/2017 01/17/16   Pisciotta, Joni Reining, PA-C  ibuprofen (ADVIL,MOTRIN) 800 MG tablet Take 1 tablet (800 mg total) by mouth 3 (three) times daily. Patient not taking: Reported on 05/22/2017 05/21/14   Piepenbrink, Victorino Dike, PA-C  levofloxacin (LEVAQUIN) 750 MG tablet Take 1 tablet (750 mg total) by mouth daily. 05/22/17   Gilda Crease, MD  naproxen (NAPROSYN) 500 MG tablet Take 1 tablet (500 mg total) by mouth 2 (two) times daily. Patient not taking: Reported on 05/22/2017 03/09/16   Sam, Ace Gins, PA-C  Phenyleph-Doxylamine-DM-APAP (NYQUIL SEVERE COLD/FLU) 5-6.25-10-325 MG/15ML LIQD Take 15 mLs by mouth as needed (for cold and flu).    [provider]  predniSONE (DELTASONE) 50 MG tablet Take 1 tablet daily with breakfast Patient not taking: Reported on 05/22/2017 01/17/16   Pisciotta, Joni Reining, PA-C  sodium chloride (OCEAN) 0.65 % SOLN nasal spray Place 1 spray into both nostrils as needed for congestion. Patient not taking: Reported on 05/22/2017 05/21/14   Piepenbrink, Victorino Dike, PA-C  traMADol (ULTRAM) 50 MG tablet 1-2 tabs po q 6 hr prn pain Maximum dose= 8 tablets per day Patient not taking: Reported on 05/22/2017 12/06/14   Hayden Rasmussen, NP  Allergies    Patient has no known allergies.  Review of Systems   Review of Systems  Constitutional:        Per HPI, otherwise negative  HENT:         Per HPI, otherwise negative  Respiratory:         Per HPI, otherwise negative  Cardiovascular:        Per HPI, otherwise negative  Gastrointestinal:  Negative for vomiting.  Endocrine:       Negative aside from HPI  Genitourinary:        Neg aside from HPI   Musculoskeletal:        Per HPI, otherwise negative  Skin: Negative.   Allergic/Immunologic: Negative for immunocompromised state.   Neurological:  Negative for syncope.   Physical Exam Updated Vital Signs BP (!) 145/101   Pulse 89   Temp 97.6 F (36.4 C) (Oral)   Resp 18   SpO2 99%   Physical Exam Constitutional:      Appearance: Normal appearance. He is well-developed. He is ill-appearing.  HENT:     Head: Atraumatic.      Comments: Trismus noted but still able to see substantial swelling of the left upper hard palate extending medially. Similar exam as earlier in the day.    Right Ear: External ear normal.     Left Ear: External ear normal.     Nose: Nose normal.     Mouth/Throat:     Pharynx: Oropharynx is clear.  Eyes:     Conjunctiva/sclera: Conjunctivae normal.  Cardiovascular:     Rate and Rhythm: Normal rate.     Pulses: Normal pulses.  Pulmonary:     Effort: Pulmonary effort is normal.  Musculoskeletal:        General: No deformity.     Cervical back: Normal range of motion.  Neurological:     Mental Status: He is alert and oriented to person, place, and time.  Psychiatric:        Mood and Affect: Mood normal.        Behavior: Behavior normal.        Thought Content: Thought content normal.        Judgment: Judgment normal.    ED Results / Procedures / Treatments   Labs (all labs ordered are listed, but only abnormal results are displayed) Labs Reviewed  CBC WITH DIFFERENTIAL/PLATELET - Abnormal; Notable for the following components:      Result Value   WBC 17.6 (*)    Neutro Abs 15.5 (*)    Abs Immature Granulocytes 0.13 (*)    All other components within normal limits  BASIC METABOLIC PANEL - Abnormal; Notable for the following components:   Glucose, Bld 114 (*)    All other components within normal limits    Radiology CT Maxillofacial W Contrast  Result Date: 01/25/2021 CLINICAL DATA:  Left facial pain and swelling and redness. Rule out abscess EXAM: CT MAXILLOFACIAL WITH CONTRAST TECHNIQUE: Multidetector CT imaging of the maxillofacial structures was performed with  intravenous contrast. Multiplanar CT image reconstructions were also generated. CONTRAST:  27mL OMNIPAQUE IOHEXOL 350 MG/ML SOLN COMPARISON:  None. FINDINGS: Osseous: Negative for facial fracture Periapical lucency around left upper second molar compatible with dental infection. Lateral to this tooth there is a 7 mm subperiosteal fluid collection compatible with abscess. Orbits: Metal foreign body in the posterior chamber of the right globe. Right lens has been removed. Left orbit normal. Sinuses: Extensive mucosal edema left maxillary  sinus. This could be odontogenic given the periapical lucency around left upper second molar. Mild mucosal edema in the right maxillary sinus and ethmoid sinuses bilaterally. No air-fluid level. Mastoid clear bilaterally. Soft tissues: Soft tissue swelling left face. This is lateral to the maxilla and mandible. Limited intracranial: Negative IMPRESSION: 1. Soft tissue swelling left face compatible with cellulitis. 2. Periapical lucency around left upper second molar compatible with dental infection. Adjacent 7 mm subperiosteal soft tissue abscess. 3. Asymmetric extensive mucosal edema left maxillary sinus possibly odontogenic in nature. 4. Metal foreign body in the posterior chamber of the right globe. Electronically Signed   By: Marlan Palau M.D.   On: 01/25/2021 13:42    Procedures Procedures   Medications Ordered in ED Medications  iohexol (OMNIPAQUE) 350 MG/ML injection 50 mL (50 mLs Intravenous Contrast Given 01/25/21 1323)  fentaNYL (SUBLIMAZE) injection 50 mcg (50 mcg Intravenous Given 01/25/21 1416)  amoxicillin-clavulanate (AUGMENTIN) 875-125 MG per tablet 1 tablet (1 tablet Oral Given 01/25/21 1416)    ED Course  I have reviewed the triage vital signs and the nursing notes.  Pertinent labs & imaging results that were available during my care of the patient were reviewed by me and considered in my medical decision making (see chart for details).  On repeat  exam the patient is in no distress he is awake, alert.  Vital signs unremarkable.  Patient's airway remained patent. I have seen his CT scan, reviewed the images, and have discussed them with our dentist on-call.  Dr. Garvin Fila has been of tremendous assistance, and will facilitate outpatient follow-up for his dental infection/abscess.  Patient amenable to, appropriate for close outpatient follow-up after initiation of antibiotics and analgesia here. Final Clinical Impression(s) / ED Diagnoses Final diagnoses:  Dental abscess     Gerhard Munch, MD 01/25/21 1441

## 2021-01-25 NOTE — ED Notes (Signed)
Pt denies chest pain, dizziness and SOB.

## 2021-01-25 NOTE — ED Triage Notes (Signed)
Pt present with left side dental pain x 3 days. Pt states the swelling has moved to the left eye.

## 2021-01-25 NOTE — Discharge Instructions (Addendum)
Please report to the emergency room as you have a severe dental abscess that will need a higher level of care such bloodwork, imaging (CT scan) and antibiotics. We have given you hydrocodone for your severe pain but please report to the emergency room now for further evaluation and care.

## 2021-01-25 NOTE — ED Provider Notes (Signed)
[facility]   MRN: [id number] DOB: 1970-12-07  Subjective:   Derek Austin is a 50 y.o. male presenting for 3-day history of acute onset worsening severe 10 out of 10 left-sided dental pain, facial pain.  Patient states that he is having difficulty opening his mouth.  Has had substantial swelling of the lower eye today.  States that he knows he is supposed to have dental work but has not made an appointment yet.  Has taken over-the-counter pain medications but is not getting any relief.  No current facility-administered medications for this encounter.  Current Outpatient Medications:    carbamide peroxide (DEBROX) 6.5 % otic solution, Place 5 drops into the right ear 2 (two) times daily as needed. (Patient not taking: Reported on 05/22/2017), Disp: 15 mL, Rfl: 0   cyclobenzaprine (FLEXERIL) 5 MG tablet, Take 1 tablet (5 mg total) by mouth 3 (three) times daily as needed for muscle spasms. (Patient not taking: Reported on 05/22/2017), Disp: 20 tablet, Rfl: 0   diclofenac (CATAFLAM) 50 MG tablet, Take 1 tablet (50 mg total) by mouth 3 (three) times daily. One tablet TID with food prn pain. (Patient not taking: Reported on 05/22/2017), Disp: 21 tablet, Rfl: 0   famotidine (PEPCID) 20 MG tablet, Take 1 tablet (20 mg total) by mouth 2 (two) times daily. (Patient not taking: Reported on 05/22/2017), Disp: 10 tablet, Rfl: 0   ibuprofen (ADVIL,MOTRIN) 800 MG tablet, Take 1 tablet (800 mg total) by mouth 3 (three) times daily. (Patient not taking: Reported on 05/22/2017), Disp: 21 tablet, Rfl: 0   levofloxacin (LEVAQUIN) 750 MG tablet, Take 1 tablet (750 mg total) by mouth daily., Disp: 5 tablet, Rfl: 0   naproxen (NAPROSYN) 500 MG tablet, Take 1 tablet (500 mg total) by mouth 2 (two) times daily. (Patient not taking: Reported on 05/22/2017), Disp: 30 tablet, Rfl: 0   Phenyleph-Doxylamine-DM-APAP (NYQUIL SEVERE COLD/FLU) 5-6.25-10-325 MG/15ML LIQD, Take 15 mLs by mouth as needed (for  cold and flu)., Disp: , Rfl:    predniSONE (DELTASONE) 50 MG tablet, Take 1 tablet daily with breakfast (Patient not taking: Reported on 05/22/2017), Disp: 5 tablet, Rfl: 0   sodium chloride (OCEAN) 0.65 % SOLN nasal spray, Place 1 spray into both nostrils as needed for congestion. (Patient not taking: Reported on 05/22/2017), Disp: 60 mL, Rfl: 0   traMADol (ULTRAM) 50 MG tablet, 1-2 tabs po q 6 hr prn pain Maximum dose= 8 tablets per day (Patient not taking: Reported on 05/22/2017), Disp: 20 tablet, Rfl: 0   No Known Allergies  Past Medical History:  Diagnosis Date   Hypertension    Subarachnoid bleed (HCC)    Subdural hematoma (HCC)      Past Surgical History:  Procedure Laterality Date   ANKLE SURGERY     HERNIA REPAIR      History reviewed. No pertinent family history.  Social History   Tobacco Use   Smoking status: Every Day    Packs/day: 1.00    Pack years: 0.00    Types: Cigarettes   Smokeless tobacco: Never  Substance Use Topics   Alcohol use: Yes    Comment: 6 pack of beer per day   Drug use: Yes    Types: Marijuana    ROS   Objective:   Vitals: BP (!) 180/118 (BP Location: Left Arm)   Pulse 98   Temp 98.3 F (36.8 C) (Oral)   Resp 19   SpO2 100%   Physical Exam Constitutional:  General: He is not in acute distress.    Appearance: Normal appearance. He is well-developed and normal weight. He is ill-appearing. He is not toxic-appearing or diaphoretic.  HENT:     Head: Normocephalic and atraumatic.      Comments: Trismus noted but still able to see substantial swelling of the left upper hard palate extending medially.    Right Ear: External ear normal.     Left Ear: External ear normal.     Nose: Nose normal.     Mouth/Throat:     Pharynx: Oropharynx is clear.  Eyes:     General: No scleral icterus.       Right eye: No discharge.        Left eye: No discharge.     Extraocular Movements: Extraocular movements intact.     Pupils: Pupils are  equal, round, and reactive to light.  Cardiovascular:     Rate and Rhythm: Normal rate.  Pulmonary:     Effort: Pulmonary effort is normal.  Musculoskeletal:     Cervical back: Normal range of motion.  Neurological:     Mental Status: He is alert and oriented to person, place, and time.  Psychiatric:        Mood and Affect: Mood normal.        Behavior: Behavior normal.        Thought Content: Thought content normal.        Judgment: Judgment normal.      Assessment and Plan :   PDMP not reviewed this encounter.  1. Dental abscess     Patient has a severe dental infection/abscess.  Recommended further evaluation in the emergency room for consideration of CT scan, labs and further intervention than we can provide in the urgent care setting.  Hydrocodone given for his severe pain.  Patient contracts for safety and will report to the ER now.   Wallis Bamberg, PA-C 01/25/21 1001

## 2021-01-25 NOTE — ED Notes (Signed)
Pt verbalizes understanding of discharge instructions. Opportunity for questions and answers were provided. Pt discharged from the ED.   ?

## 2021-01-25 NOTE — ED Notes (Signed)
Pt name called 3x, no response. Not outside or in bathroom

## 2021-01-25 NOTE — Discharge Instructions (Addendum)
As discussed, you have been diagnosed with a dental abscess and cellulitis.  It is important that you take your antibiotics as prescribed, and follow-up with our dentist colleagues.  Below is the CT interpretation from today's study.  Please bring this paperwork with you to your follow-up visit.  CT IMPRESSION: 1. Soft tissue swelling left face compatible with cellulitis. 2. Periapical lucency around left upper second molar compatible with dental infection. Adjacent 7 mm subperiosteal soft tissue abscess. 3. Asymmetric extensive mucosal edema left maxillary sinus possibly odontogenic in nature.

## 2021-09-23 IMAGING — CT CT MAXILLOFACIAL W/ CM
3 series · 15 of 47 positions shown, 18 images · IV contrast (APPLIED)
Comparison: None.

CLINICAL DATA: Left facial pain and swelling and redness. Rule out
abscess

EXAM:
CT MAXILLOFACIAL WITH CONTRAST
TECHNIQUE: Multidetector CT imaging of the maxillofacial structures was
performed with intravenous contrast. Multiplanar CT image
reconstructions were also generated.
CONTRAST:  50mL OMNIPAQUE IOHEXOL 350 MG/ML SOLN

[Series 3: facial/orbits w 2.0 st · axial · 0.38mm/px · z∈[+1172,+1340]mm · 9 of 98 slices shown, 12 images]
[im 7/98  brain]
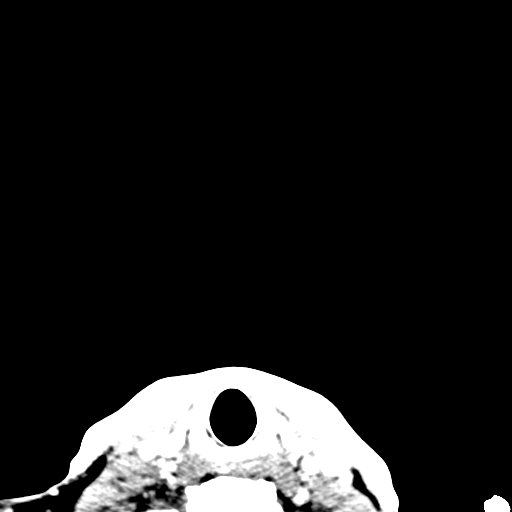
[im 7/98  bone]
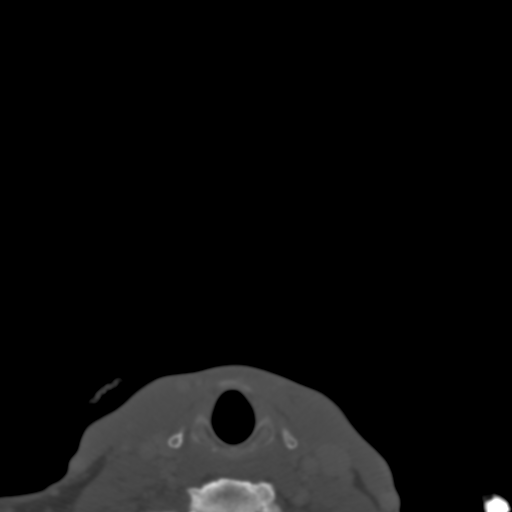
[im 17/98  bone]
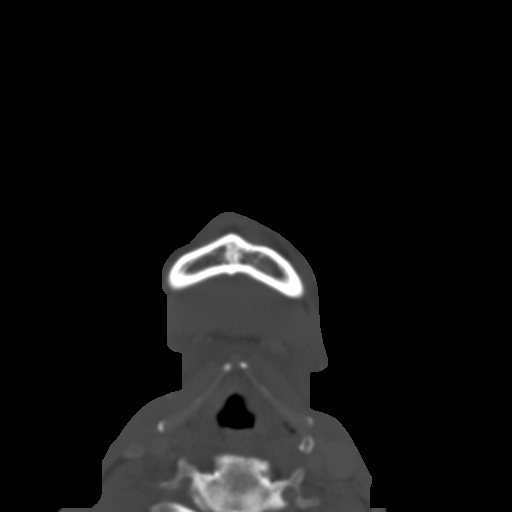
[im 27/98  bone]
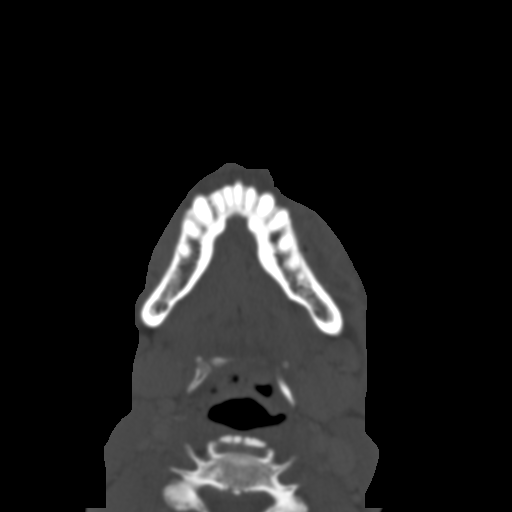
[im 37/98  bone]
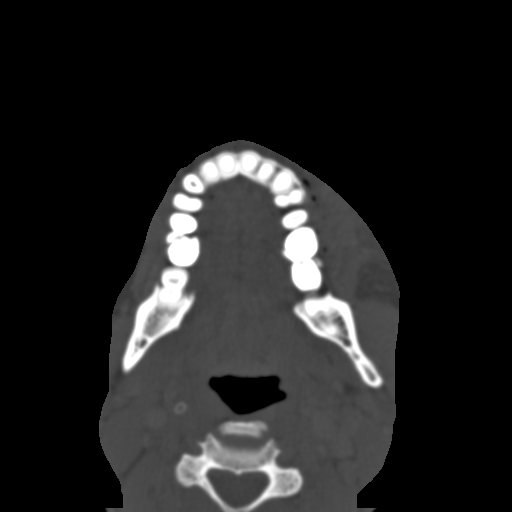
[im 51/98  brain]
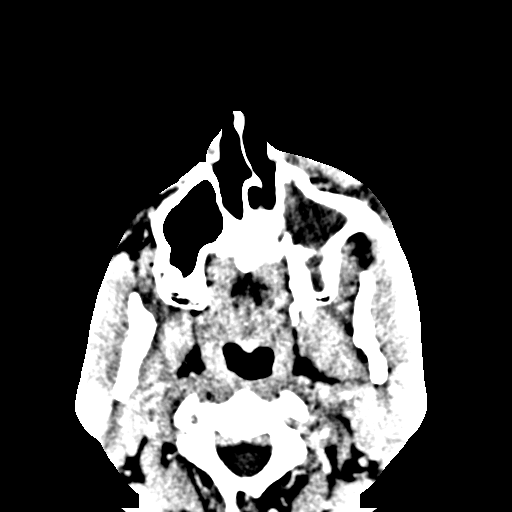
[im 51/98  bone]
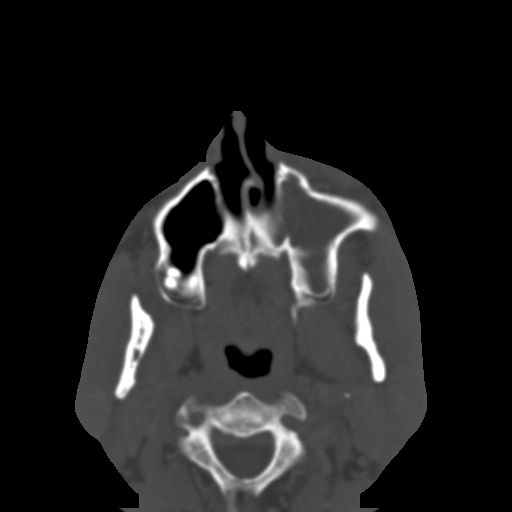
[im 61/98  bone]
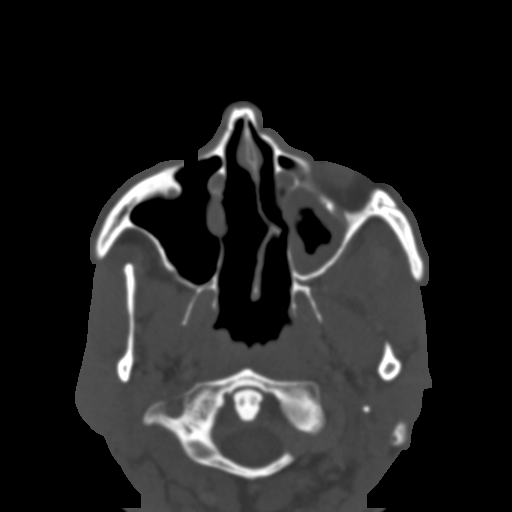
[im 71/98  bone]
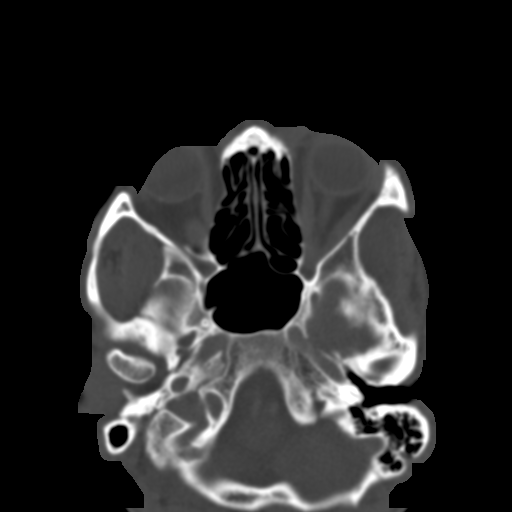
[im 81/98  bone]
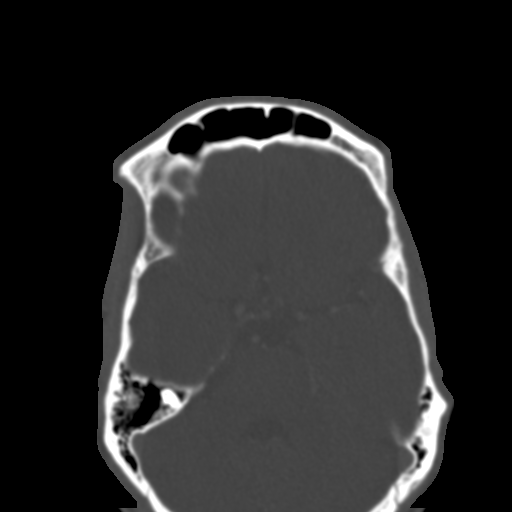
[im 91/98  brain]
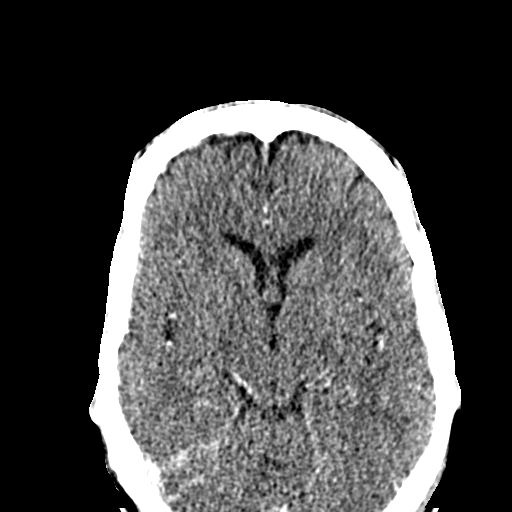
[im 91/98  bone]
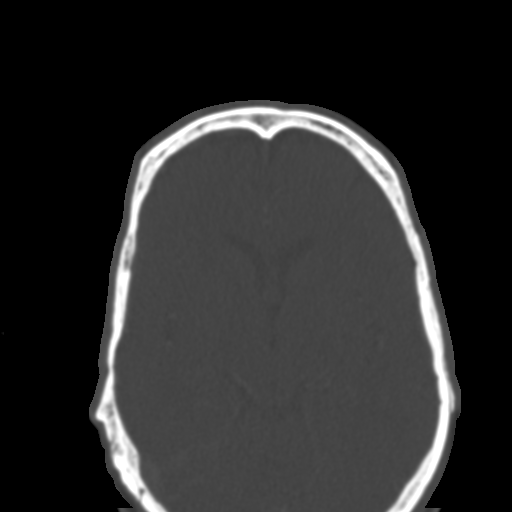

[Series 7: coronal soft tissue · coronal · 0.37mm/px · 3 of 75 slices shown]
[im 25/75  bone]
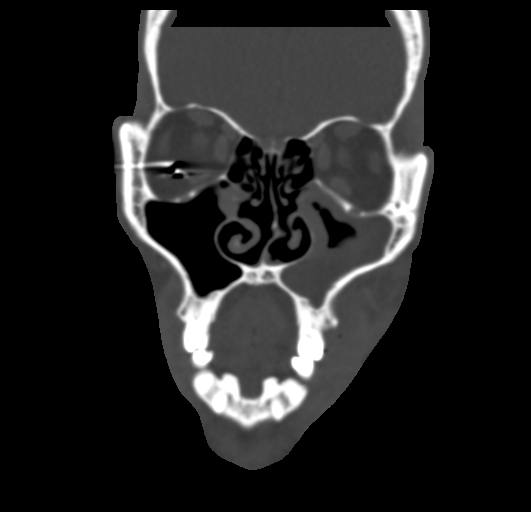
[im 33/75  bone]
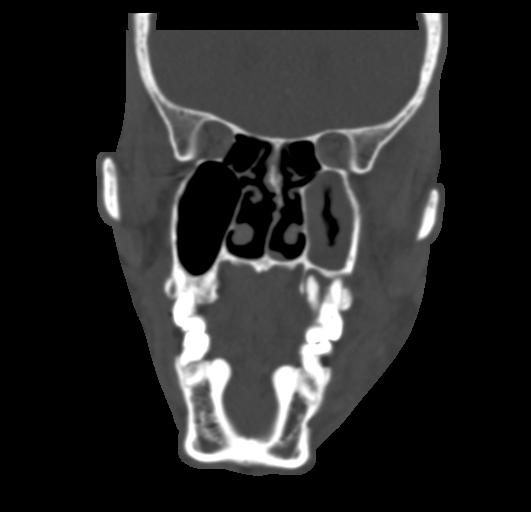
[im 42/75  bone]
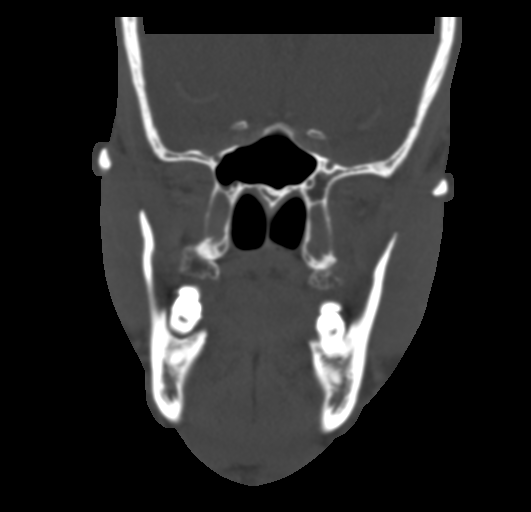

[Series 8: sagittal soft tissue · sagittal · 0.30mm/px · 3 of 100 slices shown]
[im 34/100  bone]
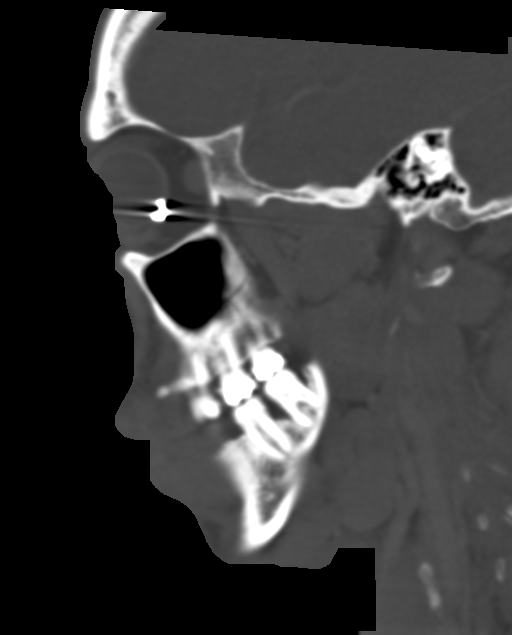
[im 50/100  bone]
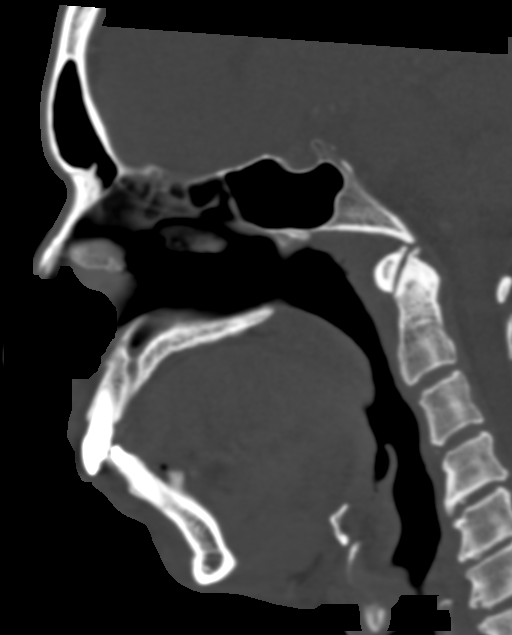
[im 67/100  bone]
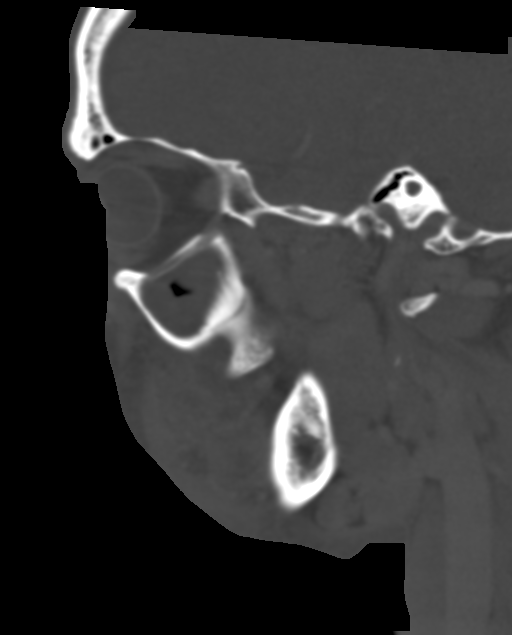

[15 of 47 positions shown; findings below may reference images not displayed]

FINDINGS: Osseous: Negative for facial fracture

Periapical lucency around left upper second molar compatible with
dental infection. Lateral to this tooth there is a 7 mm
subperiosteal fluid collection compatible with abscess.

Orbits: Metal foreign body in the posterior chamber of the right
globe. Right lens has been removed. Left orbit normal.

Sinuses: Extensive mucosal edema left maxillary sinus. This could be
odontogenic given the periapical lucency around left upper second
molar. Mild mucosal edema in the right maxillary sinus and ethmoid
sinuses bilaterally. No air-fluid level. Mastoid clear bilaterally.

Soft tissues: Soft tissue swelling left face. This is lateral to the
maxilla and mandible.

Limited intracranial: Negative
IMPRESSION: 1. Soft tissue swelling left face compatible with cellulitis.
2. Periapical lucency around left upper second molar compatible with
dental infection. Adjacent 7 mm subperiosteal soft tissue abscess.
3. Asymmetric extensive mucosal edema left maxillary sinus possibly
odontogenic in nature.
4. Metal foreign body in the posterior chamber of the right globe.

## 2022-01-30 ENCOUNTER — Ambulatory Visit (HOSPITAL_COMMUNITY)
Admission: EM | Admit: 2022-01-30 | Discharge: 2022-01-30 | Disposition: A | Discharge status: Home / Self Care | Payer: Medicaid Other | Attending: Emergency Medicine | Admitting: Emergency Medicine

## 2022-01-30 ENCOUNTER — Encounter (HOSPITAL_COMMUNITY): Payer: Self-pay

## 2022-01-30 DIAGNOSIS — M7918 Myalgia, other site: Secondary | ICD-10-CM

## 2022-01-30 MED ORDER — NAPROXEN 500 MG PO TABS: 500.0000 mg | ORAL_TABLET | Freq: Two times a day (BID) | ORAL | 0 refills | Status: AC

## 2022-01-30 MED ORDER — CYCLOBENZAPRINE HCL 5 MG PO TABS: 5.0000 mg | ORAL_TABLET | Freq: Three times a day (TID) | ORAL | 0 refills | Status: AC | PRN

## 2022-01-30 NOTE — Discharge Instructions (Addendum)
Rest, push fluids, take medication as directed.  Follow-up with PCP.  Return to urgent care or go ER for new or worsening issues or concerns.
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
## 2022-08-07 ENCOUNTER — Encounter (HOSPITAL_COMMUNITY): Payer: Self-pay | Admitting: Emergency Medicine

## 2022-08-07 ENCOUNTER — Emergency Department (HOSPITAL_COMMUNITY)
Admission: EM | Admit: 2022-08-07 | Discharge: 2022-08-08 | Disposition: A | Discharge status: Home / Self Care | Payer: Commercial Managed Care - HMO | Attending: Emergency Medicine | Admitting: Emergency Medicine

## 2022-08-07 DIAGNOSIS — R059 Cough, unspecified: Secondary | ICD-10-CM | POA: Diagnosis present

## 2022-08-07 DIAGNOSIS — I10 Essential (primary) hypertension: Secondary | ICD-10-CM | POA: Insufficient documentation

## 2022-08-07 DIAGNOSIS — M791 Myalgia, unspecified site: Secondary | ICD-10-CM | POA: Insufficient documentation

## 2022-08-07 DIAGNOSIS — R0981 Nasal congestion: Secondary | ICD-10-CM | POA: Insufficient documentation

## 2022-08-07 DIAGNOSIS — Z1152 Encounter for screening for COVID-19: Secondary | ICD-10-CM | POA: Diagnosis not present

## 2022-08-07 DIAGNOSIS — F17200 Nicotine dependence, unspecified, uncomplicated: Secondary | ICD-10-CM | POA: Diagnosis not present

## 2022-08-07 DIAGNOSIS — J111 Influenza due to unidentified influenza virus with other respiratory manifestations: Secondary | ICD-10-CM

## 2022-08-07 LAB — RESP PANEL BY RT-PCR (RSV, FLU A&B, COVID)  RVPGX2
Influenza A by PCR: NEGATIVE
Influenza B by PCR: NEGATIVE
Resp Syncytial Virus by PCR: NEGATIVE
SARS Coronavirus 2 by RT PCR: NEGATIVE

## 2022-08-07 NOTE — ED Triage Notes (Signed)
Pt reports body aches since this morning and headache since yesterday.

## 2022-08-07 NOTE — ED Provider Triage Note (Signed)
Emergency Medicine Provider Triage Evaluation Note  Derek Austin , a 51 y.o. male  was evaluated in triage.  Pt complains of headache which began yesterday with bodyaches that began today.  Patient endorses close contacts were sick with the flu.  Patient endorses subjective fever.  Denies cough, abdominal pain, nausea, vomiting  Review of Systems  Positive: As above Negative: As above  Physical Exam  There were no vitals taken for this visit. Gen:   Awake, no distress   Resp:  Normal effort  MSK:   Moves extremities without difficulty  Other:    Medical Decision Making  Medically screening exam initiated at 5:01 PM.  Appropriate orders placed.  Derek Austin was informed that the remainder of the evaluation will be completed by another provider, this initial triage assessment does not replace that evaluation, and the importance of remaining in the ED until their evaluation is complete.     Darrick Grinder, PA-C 08/07/22 1701

## 2022-08-08 MED ORDER — BENAZEPRIL HCL 10 MG PO TABS: 10.0000 mg | ORAL_TABLET | Freq: Every day | ORAL | 0 refills | Status: AC

## 2022-08-08 MED ORDER — VERAPAMIL HCL ER 120 MG PO TBCR: 120.0000 mg | EXTENDED_RELEASE_TABLET | Freq: Every day | ORAL | 2 refills | Status: AC

## 2022-08-08 MED ORDER — ONDANSETRON HCL 8 MG PO TABS: 8.0000 mg | ORAL_TABLET | Freq: Three times a day (TID) | ORAL | 0 refills | Status: DC | PRN

## 2022-08-08 NOTE — ED Notes (Signed)
Discharge instructions reviewed with patient. Patient denies any questions or concerns. Patient ambulatory out to ED lobby.

## 2022-08-08 NOTE — ED Provider Notes (Signed)
[facility] EMERGENCY DEPARTMENT Provider Note   CSN: [id number] Arrival date & time: 08/07/22  1550     History  Chief Complaint  Patient presents with   Generalized Body Aches    Derek Austin is a 51 y.o. male.  HPI 51 year old male no significant past medical history who is a smoker presents today complaining of nasal congestion, body aches, cough, poor appetite without vomiting.  He has 2 grandchildren in the same household who have had similar symptoms.  He had some subjective fevers.  He has not been taking any medications.  He denies any dyspnea.  He has not had COVID or flu vaccines this year.      Home Medications Prior to Admission medications   Medication Sig Start Date End Date Taking? Authorizing Provider  benazepril (LOTENSIN) 10 MG tablet Take 1 tablet (10 mg total) by mouth daily. 08/08/22  Yes Margarita Grizzle, MD  ondansetron (ZOFRAN) 8 MG tablet Take 1 tablet (8 mg total) by mouth every 8 (eight) hours as needed for nausea or vomiting. 08/08/22  Yes Margarita Grizzle, MD  verapamil (CALAN-SR) 120 MG CR tablet Take 1 tablet (120 mg total) by mouth at bedtime. 08/08/22  Yes Margarita Grizzle, MD  carbamide peroxide (DEBROX) 6.5 % otic solution Place 5 drops into the right ear 2 (two) times daily as needed. Patient not taking: Reported on 05/22/2017 05/21/14   Piepenbrink, Victorino Dike, PA-C  famotidine (PEPCID) 20 MG tablet Take 1 tablet (20 mg total) by mouth 2 (two) times daily. Patient not taking: Reported on 05/22/2017 01/17/16   Pisciotta, Joni Reining, PA-C  Phenyleph-Doxylamine-DM-APAP (NYQUIL SEVERE COLD/FLU) 5-6.25-10-325 MG/15ML LIQD Take 15 mLs by mouth as needed (for cold and flu).    [provider]  predniSONE (DELTASONE) 50 MG tablet Take 1 tablet daily with breakfast Patient not taking: Reported on 05/22/2017 01/17/16   Pisciotta, Joni Reining, PA-C  sodium chloride (OCEAN) 0.65 % SOLN nasal spray Place 1 spray into both nostrils as needed for  congestion. Patient not taking: Reported on 05/22/2017 05/21/14   Piepenbrink, Victorino Dike, PA-C  traMADol (ULTRAM) 50 MG tablet 1-2 tabs po q 6 hr prn pain Maximum dose= 8 tablets per day Patient not taking: Reported on 05/22/2017 12/06/14   Hayden Rasmussen, NP      Allergies    Patient has no known allergies.    Review of Systems   Review of Systems  Physical Exam Updated Vital Signs BP (!) 163/112 (BP Location: Right Arm)   Pulse 63   Temp 98.2 F (36.8 C) (Oral)   Resp 18   SpO2 99%  Physical Exam Vitals and nursing note reviewed.  Constitutional:      General: He is not in acute distress.    Appearance: Normal appearance. He is not ill-appearing.  HENT:     Head: Normocephalic.     Right Ear: External ear normal.     Left Ear: External ear normal.     Nose: Nose normal.     Mouth/Throat:     Mouth: Mucous membranes are moist.     Pharynx: Oropharynx is clear.  Eyes:     Pupils: Pupils are equal, round, and reactive to light.  Cardiovascular:     Rate and Rhythm: Normal rate and regular rhythm.     Pulses: Normal pulses.  Pulmonary:     Effort: Pulmonary effort is normal.     Breath sounds: Normal breath sounds.  Abdominal:     General: Abdomen is flat. Bowel  sounds are normal.     Palpations: Abdomen is soft.  Musculoskeletal:        General: Normal range of motion.     Cervical back: Normal range of motion.  Skin:    General: Skin is warm and dry.     Capillary Refill: Capillary refill takes less than 2 seconds.  Neurological:     General: No focal deficit present.     Mental Status: He is alert and oriented to person, place, and time.  Psychiatric:        Mood and Affect: Mood normal.        Behavior: Behavior normal.     ED Results / Procedures / Treatments   Labs (all labs ordered are listed, but only abnormal results are displayed) Labs Reviewed  RESP PANEL BY RT-PCR (RSV, FLU A&B, COVID)  [id number]    EKG None  Radiology No results  found.  Procedures Procedures    Medications Ordered in ED Medications - No data to display  ED Course/ Medical Decision Making/ A&P                           Medical Decision Making 51 year old male presents today with symptomsPatient has not had flu shot. Patient has not had immunizations this year. He has had family sick contacts.  COVID and flu are negative. consistent with viral infection. Patient is hemodynamically stable although he is hypertensive with blood pressure 163/112.  He does endorse he is from previously told he should be on blood pressure medication but has not been able to afford it in the past.  He does now have the ability to and blood pressure medication called in.  Risk OTC drugs. Prescription drug management.           Final Clinical Impression(s) / ED Diagnoses Final diagnoses:  Influenza-like illness  Hypertension, uncontrolled    Rx / DC Orders ED Discharge Orders          Ordered    ondansetron (ZOFRAN) 8 MG tablet  Every 8 hours PRN        08/08/22 0914    benazepril (LOTENSIN) 10 MG tablet  Daily        08/08/22 0914    verapamil (CALAN-SR) 120 MG CR tablet  Daily at bedtime        08/08/22 9518              Margarita Grizzle, MD 08/08/22 [phone]

## 2022-08-08 NOTE — Discharge Instructions (Signed)
Please drink plenty of fluids.  You may use acetaminophen as needed for fever and muscle aches.  Zofran as prescribed may be used for nausea or vomiting. You have been given a prescription for high blood pressure. Please use the phone number on your discharge papers and call to make an appointment as soon as possible for follow-up for recheck of your blood pressure and evaluation of the new medications efficacy. Return to the emergency department if you are having worsening symptoms at any time

## 2023-01-29 ENCOUNTER — Encounter (HOSPITAL_COMMUNITY): Payer: Self-pay

## 2023-01-29 ENCOUNTER — Ambulatory Visit (HOSPITAL_COMMUNITY)
Admission: RE | Admit: 2023-01-29 | Discharge: 2023-01-29 | Disposition: A | Discharge status: Home / Self Care | Payer: Medicaid Other | Source: Ambulatory Visit | Attending: Emergency Medicine | Admitting: Emergency Medicine

## 2023-01-29 ENCOUNTER — Other Ambulatory Visit: Payer: Self-pay

## 2023-01-29 ENCOUNTER — Ambulatory Visit (INDEPENDENT_AMBULATORY_CARE_PROVIDER_SITE_OTHER): Payer: Medicaid Other

## 2023-01-29 VITALS — BP 150/85 | HR 82 | Temp 98.8°F | Resp 19 | Ht 69.0 in | Wt 154.3 lb

## 2023-01-29 DIAGNOSIS — M62830 Muscle spasm of back: Secondary | ICD-10-CM

## 2023-01-29 DIAGNOSIS — S161XXA Strain of muscle, fascia and tendon at neck level, initial encounter: Secondary | ICD-10-CM | POA: Diagnosis not present

## 2023-01-29 DIAGNOSIS — S8001XA Contusion of right knee, initial encounter: Secondary | ICD-10-CM

## 2023-01-29 DIAGNOSIS — I10 Essential (primary) hypertension: Secondary | ICD-10-CM

## 2023-01-29 MED ORDER — IBUPROFEN 800 MG PO TABS
800.0000 mg | ORAL_TABLET | Freq: Once | ORAL | Status: AC
  Administered 2023-01-29: 800 mg via ORAL

## 2023-01-29 MED ORDER — METHOCARBAMOL 750 MG PO TABS: 1500.0000 mg | ORAL_TABLET | Freq: Three times a day (TID) | ORAL | 0 refills | Status: AC

## 2023-01-29 MED ORDER — ACETAMINOPHEN 325 MG PO TABS
975.0000 mg | ORAL_TABLET | Freq: Once | ORAL | Status: AC
  Administered 2023-01-29: 975 mg via ORAL

## 2023-01-29 MED ORDER — NAPROXEN 500 MG PO TABS: 500.0000 mg | ORAL_TABLET | Freq: Two times a day (BID) | ORAL | 0 refills | Status: AC

## 2023-01-29 MED ORDER — PREDNISONE 20 MG PO TABS: 40.0000 mg | ORAL_TABLET | Freq: Every day | ORAL | 0 refills | Status: AC

## 2023-01-29 MED ORDER — IBUPROFEN 800 MG PO TABS
ORAL_TABLET | ORAL | Status: AC
  Filled 2023-01-29: qty 1

## 2023-01-29 MED ORDER — ACETAMINOPHEN 325 MG PO TABS
ORAL_TABLET | ORAL | Status: AC
  Filled 2023-01-29: qty 3
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
## 2023-01-29 NOTE — Discharge Instructions (Addendum)
People tend to feel worse over the next several days, but most people are back to normal in 1 week. A small number of people will have persistent pain for up to six weeks. Take the Naprosyn, 1000 mg of Tylenol twice a day. This is an extremely effective combination for pain.  Robaxin as needed for muscle spasms.  Ice on your knee, heat on your neck.  Gentle stretching for your neck.  Follow-up with [facility] if not better in a week to 10 days.  Follow-up with a primary care provider for routine care and to keep an eye on your blood pressure.  Decrease your salt intake. diet and exercise will lower your blood pressure significantly. It is important to keep your blood pressure under good control, as having a elevated blood pressure for prolonged periods of time significantly increases your risk of stroke, heart attacks, kidney damage, eye damage, and other problems. Get a validated blood pressure cuff that goes on your arm, not your wrist.  Measure your blood pressure once a day, preferably at the same time every day. Keep a log of this and bring it to your next doctor's appointment.  Bring your blood pressure cuff as well.  Return here in 2 weeks for blood pressure recheck if you're unable to find a primary care physician by then. Return immediately to the ER if you start having chest pain, headache, problems seeing, problems talking, problems walking, if you feel like you're about to pass out, if you do pass out, if you have a seizure, or for any other concerns.  Go to www.goodrx.com  or www.[employer].com to look up your medications. This will give you a list of where you can find your prescriptions at the most affordable prices. Or ask the pharmacist what the cash price is, or if they have any other discount programs available to help make your medication more affordable. This can be less expensive than what you would pay with insurance.
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
## 2023-03-11 ENCOUNTER — Ambulatory Visit: Payer: Medicaid Other | Admitting: Nurse Practitioner
